# Patient Record
Sex: Female | Born: 1950 | Race: White | Hispanic: No | Smoking: Never smoker
Health system: Southern US, Community
[De-identification: ages and names within clinical notes are randomized; demographics above are authoritative.]

## PROBLEM LIST (undated history)

## (undated) DIAGNOSIS — B019 Varicella without complication: Secondary | ICD-10-CM

## (undated) DIAGNOSIS — C801 Malignant (primary) neoplasm, unspecified: Secondary | ICD-10-CM

## (undated) DIAGNOSIS — I10 Essential (primary) hypertension: Secondary | ICD-10-CM

## (undated) DIAGNOSIS — K900 Celiac disease: Secondary | ICD-10-CM

## (undated) HISTORY — PX: MOHS SURGERY: SHX181

## (undated) HISTORY — DX: Malignant (primary) neoplasm, unspecified: C80.1

## (undated) HISTORY — PX: APPENDECTOMY: SHX54

## (undated) HISTORY — DX: Varicella without complication: B01.9

## (undated) HISTORY — DX: Essential (primary) hypertension: I10

---

## 2009-06-07 HISTORY — PX: OTHER SURGICAL HISTORY: SHX169

## 2013-02-02 DIAGNOSIS — L568 Other specified acute skin changes due to ultraviolet radiation: Secondary | ICD-10-CM | POA: Insufficient documentation

## 2014-05-09 LAB — HM COLONOSCOPY

## 2014-08-22 DIAGNOSIS — D485 Neoplasm of uncertain behavior of skin: Secondary | ICD-10-CM | POA: Insufficient documentation

## 2015-06-11 DIAGNOSIS — C439 Malignant melanoma of skin, unspecified: Secondary | ICD-10-CM | POA: Diagnosis not present

## 2015-06-11 DIAGNOSIS — I10 Essential (primary) hypertension: Secondary | ICD-10-CM | POA: Diagnosis not present

## 2015-06-23 DIAGNOSIS — Z23 Encounter for immunization: Secondary | ICD-10-CM | POA: Diagnosis not present

## 2015-07-08 DIAGNOSIS — I10 Essential (primary) hypertension: Secondary | ICD-10-CM | POA: Diagnosis not present

## 2015-07-08 DIAGNOSIS — J8489 Other specified interstitial pulmonary diseases: Secondary | ICD-10-CM | POA: Diagnosis not present

## 2015-07-10 DIAGNOSIS — D485 Neoplasm of uncertain behavior of skin: Secondary | ICD-10-CM | POA: Diagnosis not present

## 2015-07-10 DIAGNOSIS — D225 Melanocytic nevi of trunk: Secondary | ICD-10-CM | POA: Diagnosis not present

## 2015-07-15 DIAGNOSIS — J189 Pneumonia, unspecified organism: Secondary | ICD-10-CM | POA: Diagnosis not present

## 2015-07-15 DIAGNOSIS — Z7189 Other specified counseling: Secondary | ICD-10-CM | POA: Diagnosis not present

## 2015-09-05 DIAGNOSIS — L259 Unspecified contact dermatitis, unspecified cause: Secondary | ICD-10-CM | POA: Diagnosis not present

## 2015-10-07 DIAGNOSIS — D485 Neoplasm of uncertain behavior of skin: Secondary | ICD-10-CM | POA: Diagnosis not present

## 2015-10-14 DIAGNOSIS — H5203 Hypermetropia, bilateral: Secondary | ICD-10-CM | POA: Diagnosis not present

## 2015-10-14 DIAGNOSIS — Z961 Presence of intraocular lens: Secondary | ICD-10-CM | POA: Diagnosis not present

## 2015-10-14 DIAGNOSIS — H43813 Vitreous degeneration, bilateral: Secondary | ICD-10-CM | POA: Diagnosis not present

## 2015-10-20 DIAGNOSIS — M94262 Chondromalacia, left knee: Secondary | ICD-10-CM | POA: Diagnosis not present

## 2015-10-20 DIAGNOSIS — Q682 Congenital deformity of knee: Secondary | ICD-10-CM | POA: Diagnosis not present

## 2015-10-23 DIAGNOSIS — L819 Disorder of pigmentation, unspecified: Secondary | ICD-10-CM | POA: Diagnosis not present

## 2015-10-31 DIAGNOSIS — Z1231 Encounter for screening mammogram for malignant neoplasm of breast: Secondary | ICD-10-CM | POA: Diagnosis not present

## 2015-10-31 DIAGNOSIS — R928 Other abnormal and inconclusive findings on diagnostic imaging of breast: Secondary | ICD-10-CM | POA: Diagnosis not present

## 2015-11-04 DIAGNOSIS — R928 Other abnormal and inconclusive findings on diagnostic imaging of breast: Secondary | ICD-10-CM | POA: Diagnosis not present

## 2015-11-04 DIAGNOSIS — Z1231 Encounter for screening mammogram for malignant neoplasm of breast: Secondary | ICD-10-CM | POA: Diagnosis not present

## 2015-11-04 DIAGNOSIS — Q682 Congenital deformity of knee: Secondary | ICD-10-CM | POA: Diagnosis not present

## 2015-11-04 DIAGNOSIS — M94262 Chondromalacia, left knee: Secondary | ICD-10-CM | POA: Diagnosis not present

## 2015-11-05 DIAGNOSIS — M94262 Chondromalacia, left knee: Secondary | ICD-10-CM | POA: Diagnosis not present

## 2015-11-07 DIAGNOSIS — M94262 Chondromalacia, left knee: Secondary | ICD-10-CM | POA: Diagnosis not present

## 2015-11-11 DIAGNOSIS — Q682 Congenital deformity of knee: Secondary | ICD-10-CM | POA: Diagnosis not present

## 2015-11-11 DIAGNOSIS — M94262 Chondromalacia, left knee: Secondary | ICD-10-CM | POA: Diagnosis not present

## 2015-11-13 DIAGNOSIS — M94262 Chondromalacia, left knee: Secondary | ICD-10-CM | POA: Diagnosis not present

## 2015-12-25 DIAGNOSIS — M810 Age-related osteoporosis without current pathological fracture: Secondary | ICD-10-CM | POA: Diagnosis not present

## 2015-12-31 DIAGNOSIS — Z124 Encounter for screening for malignant neoplasm of cervix: Secondary | ICD-10-CM | POA: Diagnosis not present

## 2016-01-08 DIAGNOSIS — L818 Other specified disorders of pigmentation: Secondary | ICD-10-CM | POA: Diagnosis not present

## 2016-01-22 DIAGNOSIS — H6123 Impacted cerumen, bilateral: Secondary | ICD-10-CM | POA: Diagnosis not present

## 2016-01-22 DIAGNOSIS — H900 Conductive hearing loss, bilateral: Secondary | ICD-10-CM | POA: Diagnosis not present

## 2016-02-20 DIAGNOSIS — S46011A Strain of muscle(s) and tendon(s) of the rotator cuff of right shoulder, initial encounter: Secondary | ICD-10-CM | POA: Diagnosis not present

## 2016-02-22 DIAGNOSIS — B029 Zoster without complications: Secondary | ICD-10-CM | POA: Diagnosis not present

## 2016-03-01 DIAGNOSIS — M792 Neuralgia and neuritis, unspecified: Secondary | ICD-10-CM | POA: Diagnosis not present

## 2016-03-09 DIAGNOSIS — K900 Celiac disease: Secondary | ICD-10-CM | POA: Diagnosis not present

## 2016-03-09 DIAGNOSIS — B028 Zoster with other complications: Secondary | ICD-10-CM | POA: Diagnosis not present

## 2016-03-09 DIAGNOSIS — M810 Age-related osteoporosis without current pathological fracture: Secondary | ICD-10-CM | POA: Diagnosis not present

## 2016-03-09 DIAGNOSIS — I10 Essential (primary) hypertension: Secondary | ICD-10-CM | POA: Diagnosis not present

## 2016-03-09 LAB — HM MAMMOGRAPHY: HM MAMMO: NORMAL (ref 0–4)

## 2016-04-19 DIAGNOSIS — Z23 Encounter for immunization: Secondary | ICD-10-CM | POA: Diagnosis not present

## 2016-04-22 DIAGNOSIS — D485 Neoplasm of uncertain behavior of skin: Secondary | ICD-10-CM | POA: Diagnosis not present

## 2016-04-22 DIAGNOSIS — B029 Zoster without complications: Secondary | ICD-10-CM | POA: Insufficient documentation

## 2016-05-17 DIAGNOSIS — D485 Neoplasm of uncertain behavior of skin: Secondary | ICD-10-CM | POA: Diagnosis not present

## 2016-05-17 DIAGNOSIS — L905 Scar conditions and fibrosis of skin: Secondary | ICD-10-CM | POA: Diagnosis not present

## 2016-05-29 DIAGNOSIS — Z23 Encounter for immunization: Secondary | ICD-10-CM | POA: Diagnosis not present

## 2016-06-22 DIAGNOSIS — M810 Age-related osteoporosis without current pathological fracture: Secondary | ICD-10-CM | POA: Diagnosis not present

## 2016-06-22 DIAGNOSIS — Z0000 Encounter for general adult medical examination without abnormal findings: Secondary | ICD-10-CM | POA: Diagnosis not present

## 2016-06-22 DIAGNOSIS — I10 Essential (primary) hypertension: Secondary | ICD-10-CM | POA: Diagnosis not present

## 2016-07-15 DIAGNOSIS — L818 Other specified disorders of pigmentation: Secondary | ICD-10-CM | POA: Diagnosis not present

## 2016-07-15 DIAGNOSIS — Z85820 Personal history of malignant melanoma of skin: Secondary | ICD-10-CM | POA: Diagnosis not present

## 2016-07-15 DIAGNOSIS — L570 Actinic keratosis: Secondary | ICD-10-CM | POA: Diagnosis not present

## 2016-09-12 DIAGNOSIS — R05 Cough: Secondary | ICD-10-CM | POA: Diagnosis not present

## 2016-09-12 DIAGNOSIS — T7840XA Allergy, unspecified, initial encounter: Secondary | ICD-10-CM | POA: Diagnosis not present

## 2016-09-13 DIAGNOSIS — H6983 Other specified disorders of Eustachian tube, bilateral: Secondary | ICD-10-CM | POA: Diagnosis not present

## 2016-09-13 DIAGNOSIS — H6123 Impacted cerumen, bilateral: Secondary | ICD-10-CM | POA: Diagnosis not present

## 2016-09-13 DIAGNOSIS — H900 Conductive hearing loss, bilateral: Secondary | ICD-10-CM | POA: Diagnosis not present

## 2016-09-28 DIAGNOSIS — G4700 Insomnia, unspecified: Secondary | ICD-10-CM | POA: Diagnosis not present

## 2017-01-12 ENCOUNTER — Encounter: Payer: Self-pay | Admitting: Family

## 2017-01-12 ENCOUNTER — Ambulatory Visit (INDEPENDENT_AMBULATORY_CARE_PROVIDER_SITE_OTHER): Payer: Medicare Other | Admitting: Family

## 2017-01-12 VITALS — BP 108/70 | HR 76 | Temp 98.2°F | Wt 133.6 lb

## 2017-01-12 DIAGNOSIS — M810 Age-related osteoporosis without current pathological fracture: Secondary | ICD-10-CM | POA: Diagnosis not present

## 2017-01-12 DIAGNOSIS — Z85820 Personal history of malignant melanoma of skin: Secondary | ICD-10-CM

## 2017-01-12 DIAGNOSIS — K900 Celiac disease: Secondary | ICD-10-CM | POA: Diagnosis not present

## 2017-01-12 DIAGNOSIS — Z1231 Encounter for screening mammogram for malignant neoplasm of breast: Secondary | ICD-10-CM

## 2017-01-12 DIAGNOSIS — K219 Gastro-esophageal reflux disease without esophagitis: Secondary | ICD-10-CM | POA: Diagnosis not present

## 2017-01-12 DIAGNOSIS — Z1211 Encounter for screening for malignant neoplasm of colon: Secondary | ICD-10-CM | POA: Insufficient documentation

## 2017-01-12 DIAGNOSIS — I10 Essential (primary) hypertension: Secondary | ICD-10-CM | POA: Diagnosis not present

## 2017-01-12 DIAGNOSIS — Z1239 Encounter for other screening for malignant neoplasm of breast: Secondary | ICD-10-CM

## 2017-01-12 MED ORDER — AMLODIPINE BESYLATE 2.5 MG PO TABS: 2.5000 mg | ORAL_TABLET | Freq: Every day | ORAL | 2 refills | Status: DC

## 2017-01-12 MED ORDER — RANITIDINE HCL 150 MG PO TABS: 150.0000 mg | ORAL_TABLET | Freq: Two times a day (BID) | ORAL | 1 refills | Status: DC

## 2017-01-12 NOTE — Patient Instructions (Addendum)
Bring immunization records- including pneumonia, shingles, tetanus   Long term use beyond 3 months of proton pump inhibitors , also called PPI's, is associated with malabsorption of vitamins, chronic kidney disease, fracture risk, and diarrheal illnesses. PPI's include Nexium, Prilosec, Protonix, Dexilant, and Prevacid.   I generally recommend trying to control acid reflux with lifestyle modifications including avoiding trigger foods, not eating 2 hours prior to bedtime. You may use histamine 2 blockers daily to twice daily ( this is Zantac, Pepcid) and then when symptoms flare, start back on PPI for short course.   Try Zantac 150mg  twice per day.   Your dexa scan shows osteoporosis. Your fracture risk for the next 10 years is 3% for your hip and 13% for major osteoporotic fracture.   We need to follow this and monitor again in 2 more years to see if improved or worsened.  For post menopausal women, guidelines recommend a diet with 1200 mg of Calcium per day. If you are eating calcium rich foods, you do not need a calcium supplement. The body better absorbs the calcium that you eat over supplementation. If you do supplement, I recommend not supplementing the full 1200 mg/ day as this can lead to increased risk of cardiovascular disease. I recommend Calcium Citrate over the counter, and you may take a total of 600 to 800 mg per day in divided doses with meals for best absorption.   For bone health, you need adequate vitamin D, and I recommend you supplement as it is harder to do so with diet alone. I recommend cholecalciferol 800 units daily.  Also, please ensure you are following a diet high in calcium -- research shows better outcomes with dietary sources including kale, yogurt, broccolii, cheese, okra, almonds- to name a few.     Also remember that exercise is a great medicine for maintain and preserve bone health. Advise moderate exercise for 30 minutes , 3 times per week.

## 2017-01-12 NOTE — Assessment & Plan Note (Signed)
Controlled. Will follow 

## 2017-01-12 NOTE — Assessment & Plan Note (Signed)
Ordered. Advised 3d. Patient understands to schedule.

## 2017-01-12 NOTE — Progress Notes (Signed)
Subjective:    Patient ID: Susan Stanley, female    DOB: 07-17-1950, 66 y.o.   MRN: [id number]  CC: [name] is a 66 y.o. female who presents today to establish care.    HPI: HTN- compliant with medication. Denies exertional chest pain or pressure, numbness or tingling radiating to left arm or jaw, palpitations, dizziness, frequent headaches, changes in vision, or shortness of breath.   Celiac- controlled by diet.   GERD- on omeprazole QOD with control.      Osteoporosis- taking calcium supplement, 600mg . last DEXA 2017 showed spine t score -2.6, osteoporotic.   Tried fosamax years ago and stopped due to concern for side effects ; notes also about to have dental implant.   Colo- polyps removed 2015, neg high grade dysplasia EGD- non erosive gastritis 2015, neg h pylori, giardia.  HISTORY:  Past Medical History:  Diagnosis Date  . Cancer ([place])    atypical melanoma  . Chicken pox   . Hypertension    Past Surgical History:  Procedure Laterality Date  . APPENDECTOMY     Family History  Problem Relation Age of Onset  . Alcohol abuse Mother   . Cancer Mother        lung  . Alcohol abuse Father   . Cancer Father        lung  . Hypertension Father   . Alcohol abuse Sister   . Arthritis Paternal Grandmother   . Cancer Paternal Grandfather        lung    Allergies: Patient has no allergy information on record. No current outpatient prescriptions on file prior to visit.   No current facility-administered medications on file prior to visit.     Social History  Substance Use Topics  . Smoking status: Never Smoker  . Smokeless tobacco: Never Used  . Alcohol use No    Review of Systems  Constitutional: Negative for chills and fever.  Eyes: Negative for visual disturbance.  Respiratory: Negative for cough.   Cardiovascular: Negative for chest pain and palpitations.  Gastrointestinal: Negative for nausea and vomiting.  Neurological: Negative for headaches.        Objective:    BP 108/70   Pulse 76   Temp 98.2 F (36.8 C) (Oral)   Wt 133 lb 9.6 oz (60.6 kg)   SpO2 98%  BP Readings from Last 3 Encounters:  01/12/17 108/70   Wt Readings from Last 3 Encounters:  01/12/17 133 lb 9.6 oz (60.6 kg)    Physical Exam  Constitutional: She appears well-developed and well-nourished.  Eyes: Conjunctivae are normal.  Cardiovascular: Normal rate, regular rhythm, normal heart sounds and normal pulses.   Pulmonary/Chest: Effort normal and breath sounds normal. She has no wheezes. She has no rhonchi. She has no rales.  Neurological: She is alert.  Skin: Skin is warm and dry.  Psychiatric: She has a normal mood and affect. Her speech is normal and behavior is normal. Thought content normal.  Vitals reviewed.      Assessment & Plan:   Problem List Items Addressed This Visit      Cardiovascular and Mediastinum   HTN (hypertension) - Primary    At goal. Will continue current regimen      Relevant Medications   amLODipine (NORVASC) 2.5 MG tablet     Digestive   Celiac disease    Controlled. Will follow      GERD (gastroesophageal reflux disease)   Relevant Medications   ranitidine (ZANTAC) 150 MG  tablet     Musculoskeletal and Integument   Osteoporosis    Reviewed last DEXA with patient and calculated FRAX score.-Major risk 13% and hip is 3%.patient declined any medication at this time and prefers lifestyle modification. Discussed appropriate vitamin D and calcium supplementation.  Discussed repeating DEXA 2019 to see if any change.         Other   History of melanoma    Follows with derm.      Screening for breast cancer    Ordered. Advised 3d. Patient understands to schedule.      Relevant Orders   MM SCREENING BREAST TOMO BILATERAL       I have changed Ms. Games's amLODipine. I am also having her start on ranitidine.   Meds ordered this encounter  Medications  . DISCONTD: amLODipine (NORVASC) 2.5 MG tablet    Sig: Take  2.5 mg by mouth daily.  . ranitidine (ZANTAC) 150 MG tablet    Sig: Take 1 tablet (150 mg total) by mouth 2 (two) times daily.    Dispense:  60 tablet    Refill:  1    Order Specific Question:   Supervising Provider    Answer:   Deborra Medina L [[id number]]  . amLODipine (NORVASC) 2.5 MG tablet    Sig: Take 1 tablet (2.5 mg total) by mouth daily.    Dispense:  90 tablet    Refill:  2    Order Specific Question:   Supervising Provider    Answer:   Crecencio Mc [[id number]]    Return precautions given.   Risks, benefits, and alternatives of the medications and treatment plan prescribed today were discussed, and patient expressed understanding.   Education regarding symptom management and diagnosis given to patient on AVS.  Continue to follow with Burnard Hawthorne, FNP for routine health maintenance.   Esteen Boling and I agreed with plan.   [name], FNP

## 2017-01-12 NOTE — Assessment & Plan Note (Signed)
Reviewed last DEXA with patient and calculated FRAX score.-Major risk 13% and hip is 3%.patient declined any medication at this time and prefers lifestyle modification. Discussed appropriate vitamin D and calcium supplementation.  Discussed repeating DEXA 2019 to see if any change.

## 2017-01-12 NOTE — Assessment & Plan Note (Signed)
Follows with derm

## 2017-01-12 NOTE — Progress Notes (Signed)
Pre visit review using our clinic review tool, if applicable. No additional management support is needed unless otherwise documented below in the visit note. 

## 2017-01-12 NOTE — Assessment & Plan Note (Signed)
At goal. Will continue current regimen.  

## 2017-01-20 DIAGNOSIS — L570 Actinic keratosis: Secondary | ICD-10-CM | POA: Diagnosis not present

## 2017-01-20 DIAGNOSIS — L578 Other skin changes due to chronic exposure to nonionizing radiation: Secondary | ICD-10-CM | POA: Diagnosis not present

## 2017-01-20 DIAGNOSIS — Z1283 Encounter for screening for malignant neoplasm of skin: Secondary | ICD-10-CM | POA: Diagnosis not present

## 2017-01-20 DIAGNOSIS — D1801 Hemangioma of skin and subcutaneous tissue: Secondary | ICD-10-CM | POA: Diagnosis not present

## 2017-01-20 DIAGNOSIS — D2270 Melanocytic nevi of unspecified lower limb, including hip: Secondary | ICD-10-CM | POA: Diagnosis not present

## 2017-01-20 DIAGNOSIS — L821 Other seborrheic keratosis: Secondary | ICD-10-CM | POA: Diagnosis not present

## 2017-01-20 DIAGNOSIS — L814 Other melanin hyperpigmentation: Secondary | ICD-10-CM | POA: Diagnosis not present

## 2017-01-20 DIAGNOSIS — D225 Melanocytic nevi of trunk: Secondary | ICD-10-CM | POA: Diagnosis not present

## 2017-01-20 DIAGNOSIS — D2260 Melanocytic nevi of unspecified upper limb, including shoulder: Secondary | ICD-10-CM | POA: Diagnosis not present

## 2017-02-08 ENCOUNTER — Encounter: Payer: Self-pay | Admitting: *Deleted

## 2017-02-22 ENCOUNTER — Ambulatory Visit
Admission: RE | Admit: 2017-02-22 | Discharge: 2017-02-22 | Disposition: A | Discharge status: Home / Self Care | Payer: Medicare Other | Source: Ambulatory Visit | Attending: Family | Admitting: Family

## 2017-02-22 DIAGNOSIS — L820 Inflamed seborrheic keratosis: Secondary | ICD-10-CM | POA: Diagnosis not present

## 2017-02-22 DIAGNOSIS — Z1239 Encounter for other screening for malignant neoplasm of breast: Secondary | ICD-10-CM

## 2017-02-22 DIAGNOSIS — R208 Other disturbances of skin sensation: Secondary | ICD-10-CM | POA: Diagnosis not present

## 2017-02-22 DIAGNOSIS — Z1231 Encounter for screening mammogram for malignant neoplasm of breast: Secondary | ICD-10-CM | POA: Insufficient documentation

## 2017-02-22 DIAGNOSIS — Z85828 Personal history of other malignant neoplasm of skin: Secondary | ICD-10-CM | POA: Diagnosis not present

## 2017-02-23 DIAGNOSIS — H43813 Vitreous degeneration, bilateral: Secondary | ICD-10-CM | POA: Diagnosis not present

## 2017-04-27 DIAGNOSIS — L570 Actinic keratosis: Secondary | ICD-10-CM | POA: Diagnosis not present

## 2017-04-27 DIAGNOSIS — Z85820 Personal history of malignant melanoma of skin: Secondary | ICD-10-CM | POA: Diagnosis not present

## 2017-04-27 DIAGNOSIS — D2271 Melanocytic nevi of right lower limb, including hip: Secondary | ICD-10-CM | POA: Diagnosis not present

## 2017-04-27 DIAGNOSIS — X32XXXA Exposure to sunlight, initial encounter: Secondary | ICD-10-CM | POA: Diagnosis not present

## 2017-04-27 DIAGNOSIS — L565 Disseminated superficial actinic porokeratosis (DSAP): Secondary | ICD-10-CM | POA: Diagnosis not present

## 2017-04-27 DIAGNOSIS — D225 Melanocytic nevi of trunk: Secondary | ICD-10-CM | POA: Diagnosis not present

## 2017-07-29 ENCOUNTER — Ambulatory Visit: Payer: Medicare Other

## 2017-08-15 ENCOUNTER — Ambulatory Visit (INDEPENDENT_AMBULATORY_CARE_PROVIDER_SITE_OTHER): Payer: Medicare Other

## 2017-08-15 ENCOUNTER — Other Ambulatory Visit: Payer: Self-pay

## 2017-08-15 VITALS — BP 110/70 | HR 69 | Temp 98.4°F | Resp 14 | Ht 66.0 in | Wt 135.1 lb

## 2017-08-15 DIAGNOSIS — Z0000 Encounter for general adult medical examination without abnormal findings: Secondary | ICD-10-CM | POA: Diagnosis not present

## 2017-08-15 DIAGNOSIS — I10 Essential (primary) hypertension: Secondary | ICD-10-CM

## 2017-08-15 MED ORDER — AMLODIPINE BESYLATE 2.5 MG PO TABS: 2.5000 mg | ORAL_TABLET | Freq: Every day | ORAL | 2 refills | Status: DC

## 2017-08-15 MED ORDER — OMEPRAZOLE 20 MG PO CPDR: 20.0000 mg | DELAYED_RELEASE_CAPSULE | Freq: Every day | ORAL | 2 refills | Status: DC

## 2017-08-15 NOTE — Progress Notes (Addendum)
Subjective:   Susan Stanley is a 67 y.o. female who presents for an Initial Medicare Annual Wellness Visit.  Review of Systems    No ROS.  Medicare Wellness Visit. Additional risk factors are reflected in the social history.  Cardiac Risk Factors include: advanced age (>82men, >25 women);hypertension     Objective:    Today's Vitals   08/15/17 0823  BP: 110/70  Pulse: 69  Resp: 14  Temp: 98.4 F (36.9 C)  TempSrc: Oral  SpO2: 97%  Weight: 135 lb 1.9 oz (61.3 kg)  Height: 5\' 6"  (1.676 m)   Body mass index is 21.81 kg/m.  Advanced Directives 08/15/2017  Does Patient Have a Medical Advance Directive? No  Would patient like information on creating a medical advance directive? No - Patient declined    Current Medications (verified) Outpatient Encounter Medications as of 08/15/2017  Medication Sig  . omeprazole (PRILOSEC) 20 MG capsule Take 20 mg by mouth daily.  Marland Kitchen amLODipine (NORVASC) 2.5 MG tablet Take 1 tablet (2.5 mg total) by mouth daily.  . [DISCONTINUED] ranitidine (ZANTAC) 150 MG tablet Take 1 tablet (150 mg total) by mouth 2 (two) times daily.   No facility-administered encounter medications on file as of 08/15/2017.     Allergies (verified) Patient has no allergy information on record.   History: Past Medical History:  Diagnosis Date  . Cancer ([place])    atypical melanoma  . Chicken pox   . Hypertension    Past Surgical History:  Procedure Laterality Date  . APPENDECTOMY     Family History  Problem Relation Age of Onset  . Alcohol abuse Mother   . COPD Mother   . Alcohol abuse Father   . Cancer Father        lung  . Hypertension Father   . Alcohol abuse Sister   . Arthritis Paternal Grandmother   . Cancer Paternal Grandfather        lung   Social History   Socioeconomic History  . Marital status: Divorced    Spouse name: None  . Number of children: None  . Years of education: None  . Highest education level: None  Social Needs  . Financial  resource strain: None  . Food insecurity - worry: Never true  . Food insecurity - inability: Never true  . Transportation needs - medical: No  . Transportation needs - non-medical: No  Occupational History  . None  Tobacco Use  . Smoking status: Never Smoker  . Smokeless tobacco: Never Used  Substance and Sexual Activity  . Alcohol use: No  . Drug use: No  . Sexual activity: No  Other Topics Concern  . None  Social History Narrative   Daughter is Environmental manager      From [place]             Tobacco Counseling Counseling given: Not Answered   Clinical Intake:  Pre-visit preparation completed: Yes  Pain : No/denies pain     Nutritional Status: BMI of 19-24  Normal Diabetes: No  How often do you need to have someone help you when you read instructions, pamphlets, or other written materials from your doctor or pharmacy?: 1 - Never  Interpreter Needed?: No      Activities of Daily Living In your present state of health, do you have any difficulty performing the following activities: 08/15/2017  Hearing? N  Vision? N  Difficulty concentrating or making decisions? N  Walking or climbing stairs? N  Dressing  or bathing? N  Doing errands, shopping? N  Preparing Food and eating ? N  Using the Toilet? N  In the past six months, have you accidently leaked urine? N  Do you have problems with loss of bowel control? N  Managing your Medications? N  Managing your Finances? N  Housekeeping or managing your Housekeeping? N  Some recent data might be hidden     Immunizations and Health Maintenance Immunization History  Administered Date(s) Administered  . Pneumococcal Conjugate-13 05/07/2016  . Pneumococcal Polysaccharide-23 05/17/2017  . Tdap 03/08/2016  . Zoster Recombinat (Shingrix) 11/05/2016, 06/21/2017   Health Maintenance Due  Topic Date Due  . Hepatitis C Screening  Jul 01, 1950  . PNA vac Low Risk Adult (1 of 2 - PCV13) 06/25/2013    Patient Care  Team: Burnard Hawthorne, FNP as PCP - General (Family Medicine)  Indicate any recent Medical Services you may have received from other than [facility] providers in the past year (date may be approximate).     Assessment:   This is a routine wellness examination for Susan Stanley.  The goal of the wellness visit is to assist the patient how to close the gaps in care and create a preventative care plan for the patient.   The roster of all physicians providing medical care to patient is listed in the Snapshot section of the chart.  Osteoporosis reviewed.    Safety issues reviewed; Lives alone.  Smoke and carbon monoxide detectors in the home. No firearms or firearms locked in a safe within the home. Wears seatbelts when driving or riding with others. No violence in the home.  They do not have excessive sun exposure.  Discussed the need for sun protection: hats, long sleeves and the use of sunscreen if there is significant sun exposure.  Patient is alert, normal appearance, oriented to person/place/and time. Correctly identified the president of the [place] and recalls of 2/3 words. Performs simple calculations and can read correct time from watch face. Displays appropriate judgement.  No new identified risk were noted.  No failures at ADL's or IADL's.    BMI- discussed the importance of a healthy diet, water intake and the benefits of aerobic exercise. Educational material provided.   24 hour diet recall: Celiac disease; wheat free diet  Daily caffeine intake: 2 cups    Dental- every 12 months  Eye- Visual acuity not assessed per patient preference since they have regular follow up with the ophthalmologist.    Sleep patterns- Sleeps 7-8 hours at night.  Wakes feeling rested.  Hepatitis C deferred per patient preference.    Influenza vaccine declined.    Patient Concerns: She has stopped Zantac 150mg  BID and would like to add Omeprazole 20mg  daily.  Pended for PCP signature.  Refill for  amlodipine sent to [facility].  Follow up with PCP scheduled.    Hearing/Vision screen Hearing Screening Comments: Patient is able to hear conversational tones without difficulty.  No issues reported.   Vision Screening Comments: Followed [facility] Annual visits [facility] Cataract extraction, bilateral Visual acuity not assessed per patient preference since they have regular follow up with the ophthalmologist  Dietary issues and exercise activities discussed: Current Exercise Habits: Home exercise routine, Type of exercise: walking, Time (Minutes): 20, Frequency (Times/Week): 4, Weekly Exercise (Minutes/Week): 80, Intensity: Mild  Goals    . Increase physical activity     [employer] structured classes      Depression Screen PHQ 2/9 Scores 08/15/2017  PHQ - 2 Score 0    Fall Risk Fall Risk  08/15/2017  Falls in the past year? No    Cognitive Function: MMSE - Mini Mental State Exam 08/15/2017  Orientation to time 5  Orientation to Place 5  Registration 3  Attention/ Calculation 5  Recall 3  Language- name 2 objects 2  Language- repeat 1  Language- follow 3 step command 3  Language- read & follow direction 1  Write a sentence 1  Copy design 1  Total score 30        Screening Tests Health Maintenance  Topic Date Due  . Hepatitis C Screening  11-Jan-1951  . PNA vac Low Risk Adult (1 of 2 - PCV13) 06/25/2013  . INFLUENZA VACCINE  04/17/2018 (Originally 01/05/2017)  . MAMMOGRAM  02/23/2019  . COLONOSCOPY  05/10/2019  . TETANUS/TDAP  06/08/2023  . DEXA SCAN  Completed      Plan:    End of life planning; Advanced aging; Advanced directives discussed.  No HCPOA/Living Will.  Additional information declined at this time.  I have personally reviewed and noted the following in the patient's chart:   . Medical and social history . Use of alcohol, tobacco or illicit drugs  . Current medications and supplements . Functional ability and  status . Nutritional status . Physical activity . Advanced directives . List of other physicians . Hospitalizations, surgeries, and ER visits in previous 12 months . Vitals . Screenings to include cognitive, depression, and falls . Referrals and appointments  In addition, I have reviewed and discussed with patient certain preventive protocols, quality metrics, and best practice recommendations. A written personalized care plan for preventive services as well as general preventive health recommendations were provided to patient.     Varney Biles, LPN   9/60/4540   Agree with plan. [name], NP Of note, patient and I have discussed long term use of PPIs

## 2017-08-15 NOTE — Patient Instructions (Addendum)
  Ms. Kiene , Thank you for taking time to come for your Medicare Wellness Visit. I appreciate your ongoing commitment to your health goals. Please review the following plan we discussed and let me know if I can assist you in the future.   Have a great day!  These are the goals we discussed: Goals    . Increase physical activity     [employer] structured classes       This is a list of the screening recommended for you and due dates:  Health Maintenance  Topic Date Due  .  Hepatitis C: One time screening is recommended by Center for Disease Control  (CDC) for  adults born from [birth year] through 1967.   1951/03/23  . Pneumonia vaccines (1 of 2 - PCV13) 06/25/2013  . Flu Shot  04/17/2018*  . Mammogram  02/23/2019  . Colon Cancer Screening  05/10/2019  . Tetanus Vaccine  06/08/2023  . DEXA scan (bone density measurement)  Completed  *Topic was postponed. The date shown is not the original due date.

## 2017-09-02 ENCOUNTER — Ambulatory Visit: Payer: Medicare Other | Admitting: Family

## 2017-09-14 ENCOUNTER — Ambulatory Visit: Payer: Medicare Other | Admitting: Family

## 2017-10-12 ENCOUNTER — Encounter: Payer: Self-pay | Admitting: Family

## 2017-10-12 ENCOUNTER — Ambulatory Visit (INDEPENDENT_AMBULATORY_CARE_PROVIDER_SITE_OTHER): Payer: Medicare Other | Admitting: Family

## 2017-10-12 VITALS — BP 124/74 | HR 71 | Temp 98.8°F | Resp 16 | Wt 139.0 lb

## 2017-10-12 DIAGNOSIS — K219 Gastro-esophageal reflux disease without esophagitis: Secondary | ICD-10-CM | POA: Diagnosis not present

## 2017-10-12 DIAGNOSIS — Z136 Encounter for screening for cardiovascular disorders: Secondary | ICD-10-CM

## 2017-10-12 DIAGNOSIS — E559 Vitamin D deficiency, unspecified: Secondary | ICD-10-CM | POA: Diagnosis not present

## 2017-10-12 DIAGNOSIS — Z13220 Encounter for screening for lipoid disorders: Secondary | ICD-10-CM

## 2017-10-12 DIAGNOSIS — H6123 Impacted cerumen, bilateral: Secondary | ICD-10-CM | POA: Diagnosis not present

## 2017-10-12 DIAGNOSIS — I10 Essential (primary) hypertension: Secondary | ICD-10-CM | POA: Diagnosis not present

## 2017-10-12 DIAGNOSIS — Z1159 Encounter for screening for other viral diseases: Secondary | ICD-10-CM | POA: Diagnosis not present

## 2017-10-12 LAB — COMPREHENSIVE METABOLIC PANEL
ALK PHOS: 149 U/L — AB (ref 39–117)
ALT: 11 U/L (ref 0–35)
AST: 14 U/L (ref 0–37)
Albumin: 4.3 g/dL (ref 3.5–5.2)
BUN: 15 mg/dL (ref 6–23)
CALCIUM: 9.4 mg/dL (ref 8.4–10.5)
CO2: 29 meq/L (ref 19–32)
CREATININE: 0.59 mg/dL (ref 0.40–1.20)
Chloride: 101 mEq/L (ref 96–112)
GFR: 107.32 mL/min (ref >60.00)
GLUCOSE: 98 mg/dL (ref 70–99)
Potassium: 4.2 mEq/L (ref 3.5–5.1)
Sodium: 137 mEq/L (ref 135–145)
Total Bilirubin: 0.3 mg/dL (ref 0.2–1.2)
Total Protein: 7.6 g/dL (ref 6.0–8.3)

## 2017-10-12 LAB — LIPID PANEL
CHOL/HDL RATIO: 4
Cholesterol: 212 mg/dL — ABNORMAL HIGH (ref 0–200)
HDL: 54.4 mg/dL (ref >39.00)
LDL Cholesterol: 141 mg/dL — ABNORMAL HIGH (ref 0–99)
NONHDL: 157.62
Triglycerides: 83 mg/dL (ref 0.0–149.0)
VLDL: 16.6 mg/dL (ref 0.0–40.0)

## 2017-10-12 LAB — VITAMIN D 25 HYDROXY (VIT D DEFICIENCY, FRACTURES): VITD: 20.53 ng/mL — AB (ref 30.00–100.00)

## 2017-10-12 NOTE — Progress Notes (Addendum)
Subjective:    Patient ID: Niala Prue, female    DOB: 07-08-1950, 67 y.o.   MRN: [id number]  CC: Yoshie Rubi is a 67 y.o. female who presents today for follow up.   HPI: Overall feels well today.  HTN-compliant with medication.  No chest pain, shortness of breath.  GERD-had to go back to using Prilosec daily.  No hoarseness, cough, trouble swallowing.  Plans for EGD with colonoscopy next due.   Does state that her ears may need 'cleaning out' and she is an 'ear wax maker.'  feels like hearing is affected. No ear discharge, ear pain.     HISTORY:  Past Medical History:  Diagnosis Date  . Cancer ([place])    atypical melanoma  . Chicken pox   . Hypertension    Past Surgical History:  Procedure Laterality Date  . APPENDECTOMY     Family History  Problem Relation Age of Onset  . Alcohol abuse Mother   . COPD Mother   . Alcohol abuse Father   . Cancer Father        lung  . Hypertension Father   . Alcohol abuse Sister   . Arthritis Paternal Grandmother   . Cancer Paternal Grandfather        lung    Allergies: Patient has no allergy information on record. Current Outpatient Medications on File Prior to Visit  Medication Sig Dispense Refill  . amLODipine (NORVASC) 2.5 MG tablet Take 1 tablet (2.5 mg total) by mouth daily. 90 tablet 2  . omeprazole (PRILOSEC) 20 MG capsule Take 1 capsule (20 mg total) by mouth at bedtime. 90 capsule 2   No current facility-administered medications on file prior to visit.     Social History   Tobacco Use  . Smoking status: Never Smoker  . Smokeless tobacco: Never Used  Substance Use Topics  . Alcohol use: No  . Drug use: No    Review of Systems  Constitutional: Negative for chills and fever.  HENT: Positive for hearing loss. Negative for ear discharge, ear pain, facial swelling, trouble swallowing and voice change.   Respiratory: Negative for cough.   Cardiovascular: Negative for chest pain and palpitations.  Gastrointestinal:  Negative for nausea and vomiting.      Objective:    BP 124/74 (BP Location: Left Arm, Patient Position: Sitting, Cuff Size: Normal)   Pulse 71   Temp 98.8 F (37.1 C) (Oral)   Resp 16   Wt 139 lb (63 kg)   SpO2 98%   BMI 22.44 kg/m  BP Readings from Last 3 Encounters:  10/12/17 124/74  08/15/17 110/70  01/12/17 108/70   Wt Readings from Last 3 Encounters:  10/12/17 139 lb (63 kg)  08/15/17 135 lb 1.9 oz (61.3 kg)  01/12/17 133 lb 9.6 oz (60.6 kg)    Physical Exam  Constitutional: She appears well-developed and well-nourished.  HENT:  Bilateral cerumen impaction  Eyes: Conjunctivae are normal.  Cardiovascular: Normal rate, regular rhythm, normal heart sounds and normal pulses.  Pulmonary/Chest: Effort normal and breath sounds normal. She has no wheezes. She has no rhonchi. She has no rales.  Neurological: She is alert.  Skin: Skin is warm and dry.  Psychiatric: She has a normal mood and affect. Her speech is normal and behavior is normal. Thought content normal.  Vitals reviewed. Bilateral cerumen impactions, largely resolved with irrigation by CMA in both left and right ear.  After which, I also examined patient and the EAC's and TM's  improved. Still dried cerumen along edges. Advised debrox to loosen cerumen     Assessment & Plan:   Problem List Items Addressed This Visit      Cardiovascular and Mediastinum   HTN (hypertension) - Primary    Well-controlled.  Continue current regimen.      Relevant Orders   Comprehensive metabolic panel     Digestive   GERD (gastroesophageal reflux disease)    Discussion regaridng risk about long-term acid reflux and esophageal cancer.   Patient feels comfortable staying on PPI for now.  Failed Zantac.  Emphasized the importance of repeating her EGD in the near future;  also discussed red flags of esophageal cancer.  Will follow.        Nervous and Auditory   Bilateral impacted cerumen    resolved        Other    Vitamin D deficiency    History of. Pending labs      Relevant Orders   VITAMIN D 25 Hydroxy (Vit-D Deficiency, Fractures)    Other Visit Diagnoses    Encounter for lipid screening for cardiovascular disease       Relevant Orders   Lipid panel   Encounter for hepatitis C screening test for low risk patient       Relevant Orders   Hepatitis C antibody       I am having Charvi Vanessen maintain her amLODipine and omeprazole.   No orders of the defined types were placed in this encounter.   Return precautions given.   Risks, benefits, and alternatives of the medications and treatment plan prescribed today were discussed, and patient expressed understanding.   Education regarding symptom management and diagnosis given to patient on AVS.  Continue to follow with Burnard Hawthorne, FNP for routine health maintenance.   Eternity Vanderpol and I agreed with plan.   [name], FNP

## 2017-10-12 NOTE — Assessment & Plan Note (Signed)
Discussion regaridng risk about long-term acid reflux and esophageal cancer.   Patient feels comfortable staying on PPI for now.  Failed Zantac.  Emphasized the importance of repeating her EGD in the near future;  also discussed red flags of esophageal cancer.  Will follow.

## 2017-10-12 NOTE — Assessment & Plan Note (Signed)
Well-controlled.  Continue current regimen. 

## 2017-10-12 NOTE — Patient Instructions (Signed)
May try OTC Debrox  Pleasure seeing you!

## 2017-10-12 NOTE — Addendum Note (Signed)
Addended by: Johna Sheriff on: 10/12/2017 10:34 AM   Modules accepted: Orders

## 2017-10-12 NOTE — Progress Notes (Signed)
Bilateral ear lavage performed on right ear and left ear.  Successfully irrigated left ear and right ear irrigated and had a little remaining wax in ear.

## 2017-10-12 NOTE — Assessment & Plan Note (Signed)
resolved 

## 2017-10-12 NOTE — Assessment & Plan Note (Signed)
History of. Pending labs

## 2017-10-13 LAB — HEPATITIS C ANTIBODY
HEP C AB: NONREACTIVE
SIGNAL TO CUT-OFF: 0.02 (ref <1.00)

## 2017-10-16 ENCOUNTER — Other Ambulatory Visit: Payer: Self-pay | Admitting: Family

## 2017-10-16 DIAGNOSIS — R748 Abnormal levels of other serum enzymes: Secondary | ICD-10-CM

## 2017-10-18 ENCOUNTER — Telehealth: Payer: Self-pay

## 2017-10-18 ENCOUNTER — Encounter: Payer: Self-pay | Admitting: Family

## 2017-10-18 NOTE — Telephone Encounter (Signed)
Please advise 

## 2017-10-18 NOTE — Telephone Encounter (Signed)
Patient is calling to follow up on these test results being released in [place].

## 2017-10-18 NOTE — Telephone Encounter (Signed)
Patient is concerned due to ALT is elevated. Please advise.   She has made lab appointment to re-check in 1 month. Please advise.

## 2017-10-18 NOTE — Telephone Encounter (Signed)
Copied from [place] [phone]. Topic: Quick Communication - Lab Results >> Oct 17, 2017  4:02 PM Bare, Patriciaann Clan, CMA wrote: Called patient to inform them of  lab results. When patient returns call, triage nurse may} disclose results. >> Oct 18, 2017  9:35 AM Scherrie Gerlach wrote: Pt would like to view these results on mychart. Can you release them?

## 2017-10-19 ENCOUNTER — Other Ambulatory Visit: Payer: Self-pay | Admitting: Family

## 2017-10-19 DIAGNOSIS — M810 Age-related osteoporosis without current pathological fracture: Secondary | ICD-10-CM

## 2017-10-19 NOTE — Telephone Encounter (Signed)
Patient advised of below and verbalized understanding.  

## 2017-10-19 NOTE — Progress Notes (Signed)
close

## 2017-10-19 NOTE — Telephone Encounter (Signed)
Call pt May reassure her. It is mildly elevated and often times this is a transient nonspecific finding.  Most important to see if persists and then we can discuss further evaluation if persists  Please advise her not to worry at this time- lets await on repeat labs :)

## 2017-10-26 DIAGNOSIS — D224 Melanocytic nevi of scalp and neck: Secondary | ICD-10-CM | POA: Diagnosis not present

## 2017-10-26 DIAGNOSIS — D485 Neoplasm of uncertain behavior of skin: Secondary | ICD-10-CM | POA: Diagnosis not present

## 2017-10-26 DIAGNOSIS — Z85820 Personal history of malignant melanoma of skin: Secondary | ICD-10-CM | POA: Diagnosis not present

## 2017-10-26 DIAGNOSIS — D2271 Melanocytic nevi of right lower limb, including hip: Secondary | ICD-10-CM | POA: Diagnosis not present

## 2017-10-26 DIAGNOSIS — D225 Melanocytic nevi of trunk: Secondary | ICD-10-CM | POA: Diagnosis not present

## 2017-10-26 DIAGNOSIS — D2261 Melanocytic nevi of right upper limb, including shoulder: Secondary | ICD-10-CM | POA: Diagnosis not present

## 2017-10-26 DIAGNOSIS — D2272 Melanocytic nevi of left lower limb, including hip: Secondary | ICD-10-CM | POA: Diagnosis not present

## 2017-10-26 DIAGNOSIS — L438 Other lichen planus: Secondary | ICD-10-CM | POA: Diagnosis not present

## 2017-10-26 DIAGNOSIS — Z08 Encounter for follow-up examination after completed treatment for malignant neoplasm: Secondary | ICD-10-CM | POA: Diagnosis not present

## 2017-10-26 DIAGNOSIS — D2262 Melanocytic nevi of left upper limb, including shoulder: Secondary | ICD-10-CM | POA: Diagnosis not present

## 2017-10-26 DIAGNOSIS — Z85828 Personal history of other malignant neoplasm of skin: Secondary | ICD-10-CM | POA: Diagnosis not present

## 2017-10-26 DIAGNOSIS — C44722 Squamous cell carcinoma of skin of right lower limb, including hip: Secondary | ICD-10-CM | POA: Diagnosis not present

## 2017-11-17 ENCOUNTER — Telehealth: Payer: Self-pay

## 2017-11-17 NOTE — Telephone Encounter (Signed)
Spoke with patient she is coming in to see Joycelyn Schmid for acute visit tomorrow due to sickness

## 2017-11-17 NOTE — Telephone Encounter (Signed)
Copied from [place] [id number]. Topic: Appointment Scheduling - Scheduling Inquiry for Clinic >> Nov 17, 2017  8:13 AM Synthia Innocent wrote: Reason for CRM: Patient is scheduled for lab work tomorrow, is not feeling well has cough. Scheduled to see [facility] tomorrow. Wants to to know if she should get lab work while feeling ill. Requesting message to be sent back by [facility]

## 2017-11-18 ENCOUNTER — Other Ambulatory Visit: Payer: Medicare Other

## 2017-11-18 ENCOUNTER — Encounter: Payer: Self-pay | Admitting: Family

## 2017-11-18 ENCOUNTER — Ambulatory Visit (INDEPENDENT_AMBULATORY_CARE_PROVIDER_SITE_OTHER): Payer: Medicare Other | Admitting: Family

## 2017-11-18 VITALS — BP 144/78 | HR 78 | Temp 98.8°F | Resp 16 | Wt 136.2 lb

## 2017-11-18 DIAGNOSIS — J40 Bronchitis, not specified as acute or chronic: Secondary | ICD-10-CM | POA: Diagnosis not present

## 2017-11-18 DIAGNOSIS — R748 Abnormal levels of other serum enzymes: Secondary | ICD-10-CM

## 2017-11-18 DIAGNOSIS — M810 Age-related osteoporosis without current pathological fracture: Secondary | ICD-10-CM | POA: Diagnosis not present

## 2017-11-18 MED ORDER — HYDROCODONE-HOMATROPINE 5-1.5 MG/5ML PO SYRP: 5.0000 mL | ORAL_SOLUTION | Freq: Every evening | ORAL | 0 refills | Status: DC | PRN

## 2017-11-18 MED ORDER — AZITHROMYCIN 250 MG PO TABS: ORAL_TABLET | ORAL | 0 refills | Status: DC

## 2017-11-18 NOTE — Addendum Note (Signed)
Addended by: Arby Barrette on: 11/18/2017 11:14 AM   Modules accepted: Orders

## 2017-11-18 NOTE — Addendum Note (Signed)
Addended by: Arby Barrette on: 11/18/2017 11:15 AM   Modules accepted: Orders

## 2017-11-18 NOTE — Progress Notes (Signed)
Subjective:    Patient ID: Susan Stanley, female    DOB: [birth year], 67 y.o.   MRN: [id number]  CC: Susan Stanley is a 67 y.o. female who presents today for an acute visit.    HPI: CC: cough x 6 days, worsening.  Endorses chills and tactile fever 2 days ago, resolved. Has sore throat, sinus pressure.  No wheezing, SOB, vision changes.   Tried tussion dm with some relief.   Daughter and son - in law had cough as well.   HTN- no cp, sob. Compliant with medication.     Never smoker HISTORY:  Past Medical History:  Diagnosis Date  . 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 Chicken pox   . Hypertension    Past Surgical History:  Procedure Laterality Date  . APPENDECTOMY     Family History  Problem Relation Age of Onset  . Alcohol abuse Mother   . COPD Mother   . Alcohol abuse Father   . Cancer Father        lung  . Hypertension Father   . Alcohol abuse Sister   . Arthritis Paternal Grandmother   . Cancer Paternal Grandfather        lung    Allergies: Penicillin g Current Outpatient Medications on File Prior to Visit  Medication Sig Dispense Refill  . amLODipine (NORVASC) 2.5 MG tablet Take 1 tablet (2.5 mg total) by mouth daily. 90 tablet 2  . omeprazole (PRILOSEC) 20 MG capsule Take 1 capsule (20 mg total) by mouth at bedtime. 90 capsule 2   No current facility-administered medications on file prior to visit.     Social History   Tobacco Use  . Smoking status: Never Smoker  . Smokeless tobacco: Never Used  Substance Use Topics  . Alcohol use: No  . Drug use: No    Review of Systems  Constitutional: Negative for chills and fever.  HENT: Positive for congestion, sinus pressure and sore throat. Negative for ear pain and trouble swallowing.   Eyes: Negative for visual disturbance.  Respiratory: Positive for cough. Negative for shortness of breath and wheezing.   Cardiovascular: Negative for chest pain and palpitations.  Gastrointestinal: Negative for nausea and  vomiting.  Neurological: Negative for headaches.      Objective:    BP (!) 144/78 (BP Location: Right Arm, Patient Position: Sitting, Cuff Size: Normal)   Pulse 78   Temp 98.8 F (37.1 C) (Oral)   Resp 16   Wt 136 lb 4 oz (61.8 kg)   SpO2 98%   BMI 21.99 kg/m  BP Readings from Last 3 Encounters:  11/18/17 (!) 144/78  10/12/17 124/74  08/15/17 110/70     Physical Exam  Constitutional: She appears well-developed and well-nourished.  HENT:  Head: Normocephalic and atraumatic.  Right Ear: Hearing, tympanic membrane, external ear and ear canal normal. No drainage, swelling or tenderness. No foreign bodies. Tympanic membrane is not erythematous and not bulging. No middle ear effusion. No decreased hearing is noted.  Left Ear: Hearing, tympanic membrane, external ear and ear canal normal. No drainage, swelling or tenderness. No foreign bodies. Tympanic membrane is not erythematous and not bulging.  No middle ear effusion. No decreased hearing is noted.  Nose: Nose normal. No rhinorrhea. Right sinus exhibits no maxillary sinus tenderness and no frontal sinus tenderness. Left sinus exhibits no maxillary sinus tenderness and no frontal sinus tenderness.  Mouth/Throat: Uvula is midline, oropharynx is clear and moist and mucous membranes are  normal. No oropharyngeal exudate, posterior oropharyngeal edema, posterior oropharyngeal erythema or tonsillar abscesses.  Eyes: Conjunctivae are normal.  Cardiovascular: Regular rhythm, normal heart sounds and normal pulses.  Pulmonary/Chest: Effort normal and breath sounds normal. She has no wheezes. She has no rhonchi. She has no rales.  Lymphadenopathy:       Head (right side): No submental, no submandibular, no tonsillar, no preauricular, no posterior auricular and no occipital adenopathy present.       Head (left side): No submental, no submandibular, no tonsillar, no preauricular, no posterior auricular and no occipital adenopathy present.    She  has no cervical adenopathy.  Neurological: She is alert.  Skin: Skin is warm and dry.  Psychiatric: She has a normal mood and affect. Her speech is normal and behavior is normal. Thought content normal.  Vitals reviewed.      Assessment & Plan:    1. Elevated alkaline phosphatase level  - Comprehensive metabolic panel  2. Age-related osteoporosis without current pathological fracture  - Alkaline phosphatase, isoenzymes  3. Bronchitis- primary Patient well-appearing.  No acute respiratory distress.  Advised patient suspect more likely viral component.  It is Friday and patient does state symptoms have been worsening, in this context, we jointly agreed togive antibiotic rx.  She may wait another day or 2 to fill this and treat conservatively.  Patient let me know if she is no better. Note, typically blood pressure of good control ; suspect amount of coughing while in exam room contributory to elevation. Patient will monitor at home.   - HYDROcodone-homatropine [facility]) 5-1.5 MG/5ML syrup; Take 5 mLs by mouth at bedtime as needed for cough.  Dispense: 30 mL; Refill: 0 - azithromycin (ZITHROMAX) 250 MG tablet; Tale 500 mg PO on day 1, then 250 mg PO q24h x 4 days.  Dispense: 6 tablet; Refill: 0   I am having Susan Stanley start on HYDROcodone-homatropine and azithromycin. I am also having her maintain her amLODipine and omeprazole.   Meds ordered this encounter  Medications  . HYDROcodone-homatropine (HYCODAN) 5-1.5 MG/5ML syrup    Sig: Take 5 mLs by mouth at bedtime as needed for cough.    Dispense:  30 mL    Refill:  0    Order Specific Question:   Supervising Provider    Answer:   Derrel Nip, TERESA L [[id number]]  . azithromycin (ZITHROMAX) 250 MG tablet    Sig: Tale 500 mg PO on day 1, then 250 mg PO q24h x 4 days.    Dispense:  6 tablet    Refill:  0    Order Specific Question:   Supervising Provider    Answer:   Crecencio Mc [[id number]]    Return precautions given.   Risks,  benefits, and alternatives of the medications and treatment plan prescribed today were discussed, and patient expressed understanding.   Education regarding symptom management and diagnosis given to patient on AVS.  Continue to follow with Burnard Hawthorne, FNP for routine health maintenance.   Susan Stanley and I agreed with plan.   [name], FNP

## 2017-11-18 NOTE — Patient Instructions (Signed)
I suspect that your infection is viral in nature.  As discussed, I advise that you wait to fill the antibiotic after 1-2 days of symptom management to see if your symptoms improve. If you do not show improvement, you may take the antibiotic as prescribed.   Ensure to take probiotics while on antibiotics and also for 2 weeks after completion. It is important to re-colonize the gut with good bacteria and also to prevent any diarrheal infections associated with antibiotic use.   Please take cough medication at night only as needed. As we discussed, I do not recommend dosing throughout the day as coughing is a protective mechanism . It also helps to break up thick mucous.  Do not take cough suppressants with alcohol as can lead to trouble breathing. Advise caution if taking cough suppressant and operating machinery ( i.e driving a car) as you may feel very tired.      Let me know if not better!

## 2017-11-21 LAB — COMPREHENSIVE METABOLIC PANEL
ALK PHOS: 119 IU/L — AB (ref 39–117)
ALT: 9 IU/L (ref 0–32)
AST: 11 IU/L (ref 0–40)
Albumin/Globulin Ratio: 1.6 (ref 1.2–2.2)
Albumin: 4.5 g/dL (ref 3.6–4.8)
BUN/Creatinine Ratio: 17 (ref 12–28)
BUN: 12 mg/dL (ref 8–27)
CHLORIDE: 100 mmol/L (ref 96–106)
CO2: 22 mmol/L (ref 20–29)
Calcium: 9.6 mg/dL (ref 8.7–10.3)
Creatinine, Ser: 0.7 mg/dL (ref 0.57–1.00)
GFR calc non Af Amer: 89 mL/min/{1.73_m2} (ref >59)
GFR, EST AFRICAN AMERICAN: 102 mL/min/{1.73_m2} (ref >59)
GLUCOSE: 124 mg/dL — AB (ref 65–99)
Globulin, Total: 2.8 g/dL (ref 1.5–4.5)
Potassium: 4.3 mmol/L (ref 3.5–5.2)
Sodium: 138 mmol/L (ref 134–144)
Total Protein: 7.3 g/dL (ref 6.0–8.5)

## 2017-11-21 LAB — ALKALINE PHOSPHATASE, ISOENZYMES
BONE FRACTION: 36 % (ref 14–68)
INTESTINAL FRAC.: 0 % (ref 0–18)
LIVER FRACTION: 64 % (ref 18–85)

## 2017-11-25 ENCOUNTER — Encounter: Payer: Self-pay | Admitting: Family

## 2017-11-25 ENCOUNTER — Other Ambulatory Visit: Payer: Self-pay | Admitting: Family

## 2017-11-25 DIAGNOSIS — R748 Abnormal levels of other serum enzymes: Secondary | ICD-10-CM

## 2017-11-28 NOTE — Telephone Encounter (Signed)
Called patient and she states she wants to hold off on checking alkaline phosphatase , she has been reducing gluten in diet. She will call back to schedule lab appointment.

## 2017-11-28 NOTE — Telephone Encounter (Signed)
Copied from [place] [id number]. Topic: Quick Communication - See Telephone Encounter >> Nov 28, 2017 10:14 AM Percell Belt A wrote: CRM for notification. See Telephone encounter for: 11/28/17. Pt called in about her mychart message that she sent.  She has a few question for Arnette   Best number is [phone]

## 2017-12-07 ENCOUNTER — Ambulatory Visit
Admission: RE | Admit: 2017-12-07 | Discharge: 2017-12-07 | Disposition: A | Discharge status: Home / Self Care | Payer: Medicare Other | Source: Ambulatory Visit | Attending: Family | Admitting: Family

## 2017-12-07 DIAGNOSIS — M8589 Other specified disorders of bone density and structure, multiple sites: Secondary | ICD-10-CM | POA: Diagnosis not present

## 2017-12-07 DIAGNOSIS — Z780 Asymptomatic menopausal state: Secondary | ICD-10-CM | POA: Diagnosis not present

## 2017-12-07 DIAGNOSIS — M810 Age-related osteoporosis without current pathological fracture: Secondary | ICD-10-CM | POA: Insufficient documentation

## 2017-12-13 ENCOUNTER — Encounter: Payer: Self-pay | Admitting: Family

## 2017-12-14 DIAGNOSIS — Z85828 Personal history of other malignant neoplasm of skin: Secondary | ICD-10-CM | POA: Diagnosis not present

## 2017-12-14 DIAGNOSIS — L905 Scar conditions and fibrosis of skin: Secondary | ICD-10-CM | POA: Diagnosis not present

## 2017-12-14 DIAGNOSIS — L578 Other skin changes due to chronic exposure to nonionizing radiation: Secondary | ICD-10-CM | POA: Diagnosis not present

## 2017-12-14 DIAGNOSIS — C434 Malignant melanoma of scalp and neck: Secondary | ICD-10-CM | POA: Diagnosis not present

## 2017-12-20 ENCOUNTER — Telehealth: Payer: Self-pay | Admitting: *Deleted

## 2017-12-20 DIAGNOSIS — C44729 Squamous cell carcinoma of skin of left lower limb, including hip: Secondary | ICD-10-CM | POA: Diagnosis not present

## 2017-12-20 NOTE — Telephone Encounter (Signed)
Copied from [place] [id number]. Topic: Inquiry >> Dec 20, 2017 11:24 AM Susan Stanley, NT wrote: Reason for CRM: patient states she was supposed to have a ultrasound of her liver and she canceled that appointment but she would like to reschedule that. Please advise.

## 2017-12-21 ENCOUNTER — Encounter (INDEPENDENT_AMBULATORY_CARE_PROVIDER_SITE_OTHER): Payer: Self-pay

## 2017-12-21 NOTE — Telephone Encounter (Signed)
Message sent to pt with phone number to reschedule appt

## 2018-01-04 ENCOUNTER — Ambulatory Visit
Admission: RE | Admit: 2018-01-04 | Discharge: 2018-01-04 | Disposition: A | Discharge status: Home / Self Care | Payer: Medicare Other | Source: Ambulatory Visit | Attending: Family | Admitting: Family

## 2018-01-04 DIAGNOSIS — R932 Abnormal findings on diagnostic imaging of liver and biliary tract: Secondary | ICD-10-CM | POA: Insufficient documentation

## 2018-01-04 DIAGNOSIS — R748 Abnormal levels of other serum enzymes: Secondary | ICD-10-CM | POA: Diagnosis not present

## 2018-01-04 DIAGNOSIS — K7689 Other specified diseases of liver: Secondary | ICD-10-CM | POA: Diagnosis not present

## 2018-01-09 ENCOUNTER — Encounter: Payer: Self-pay | Admitting: Family

## 2018-01-10 ENCOUNTER — Encounter: Payer: Self-pay | Admitting: Family

## 2018-01-10 ENCOUNTER — Telehealth: Payer: Self-pay

## 2018-01-10 DIAGNOSIS — K760 Fatty (change of) liver, not elsewhere classified: Secondary | ICD-10-CM | POA: Insufficient documentation

## 2018-01-10 NOTE — Telephone Encounter (Signed)
Copied from [place] [id number]. Topic: Quick Communication - Other Results >> Jan 10, 2018  9:22 AM Lennox Solders wrote: Liver ultrasound results

## 2018-01-10 NOTE — Telephone Encounter (Signed)
Advised you were out of office

## 2018-01-11 ENCOUNTER — Encounter: Payer: Self-pay | Admitting: Family

## 2018-01-16 ENCOUNTER — Other Ambulatory Visit: Payer: Self-pay | Admitting: Family

## 2018-01-16 ENCOUNTER — Ambulatory Visit: Payer: Medicare Other

## 2018-01-16 DIAGNOSIS — K760 Fatty (change of) liver, not elsewhere classified: Secondary | ICD-10-CM

## 2018-01-16 DIAGNOSIS — K900 Celiac disease: Secondary | ICD-10-CM

## 2018-01-18 ENCOUNTER — Ambulatory Visit: Payer: Medicare Other | Admitting: Family

## 2018-01-19 IMAGING — MG MM DIGITAL SCREENING BILAT W/ TOMO W/ CAD
9 of 13 series · 9 of 29 positions shown · non-contrast
Comparison: None.

CLINICAL DATA: Screening.

EXAM:
2D DIGITAL SCREENING BILATERAL MAMMOGRAM WITH CAD AND ADJUNCT TOMO

[R MLO (1 of 2)]
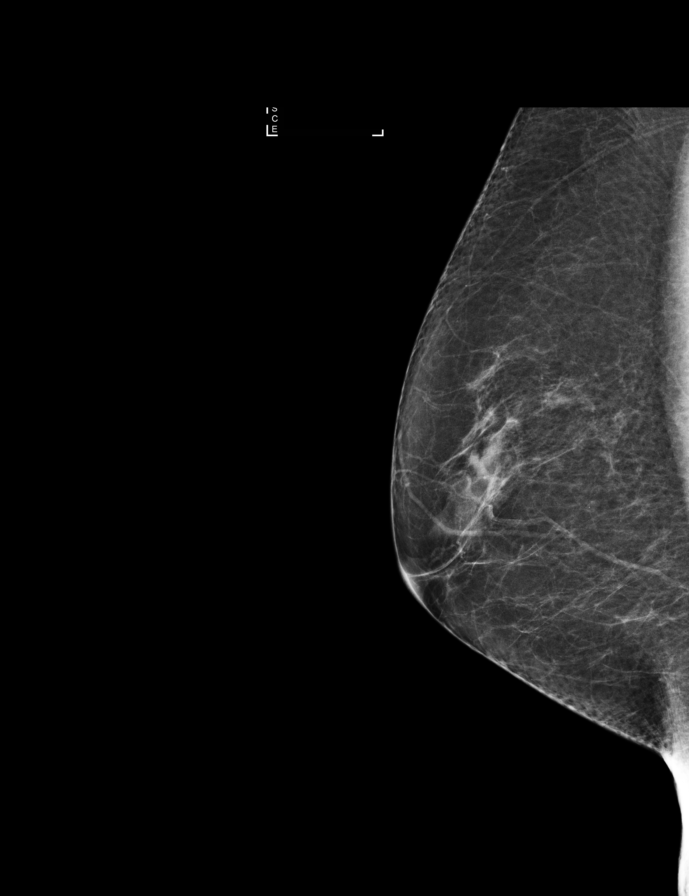

[L MLO]
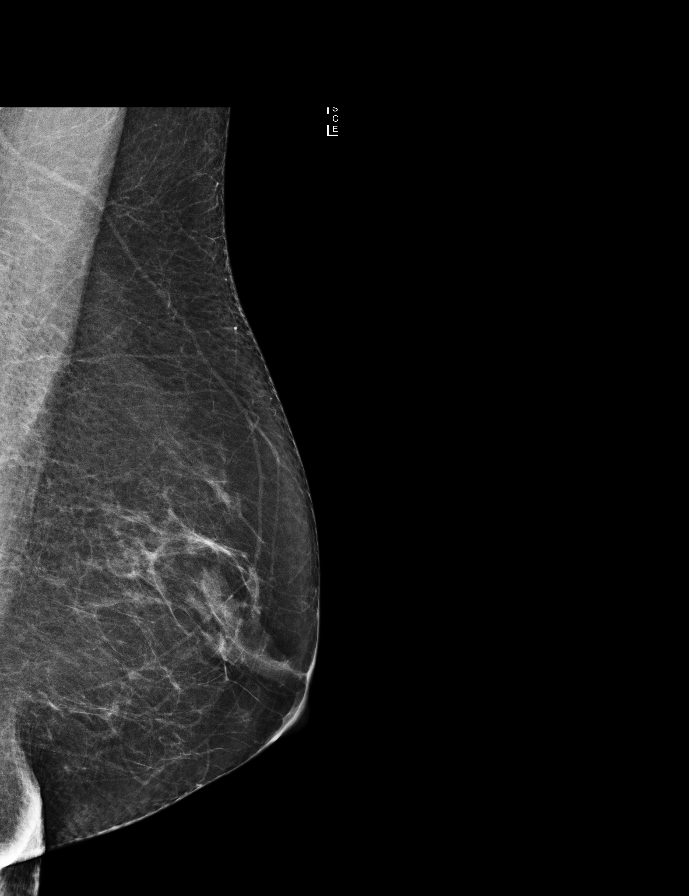

[R CC synth-2D]
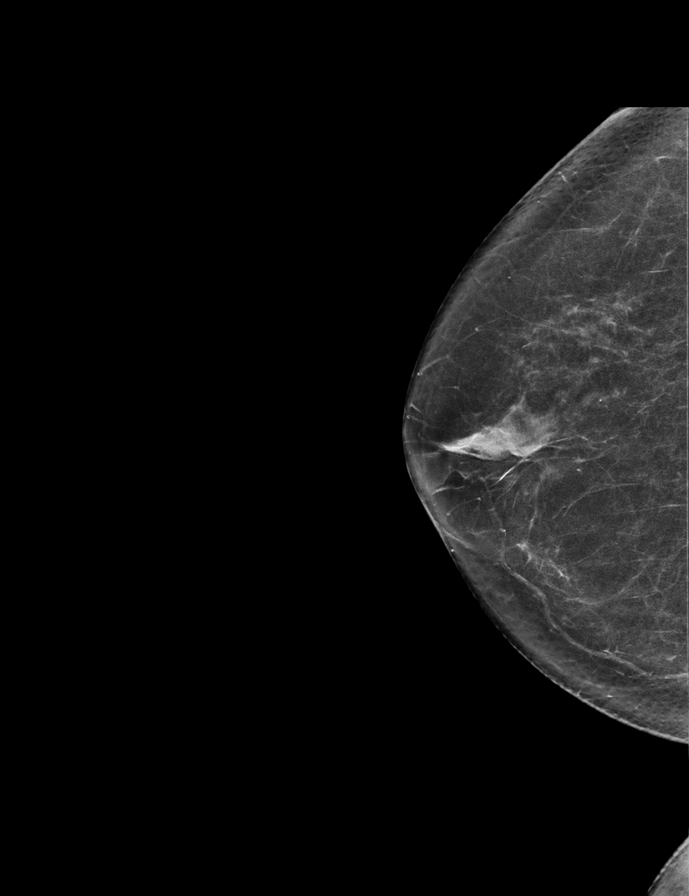

[L CC]
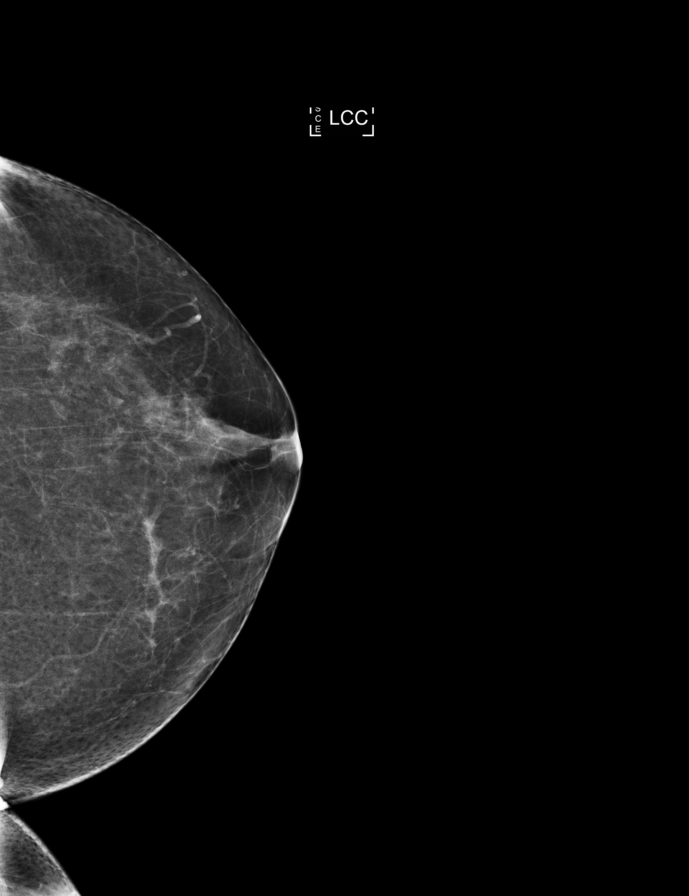

[R CC]
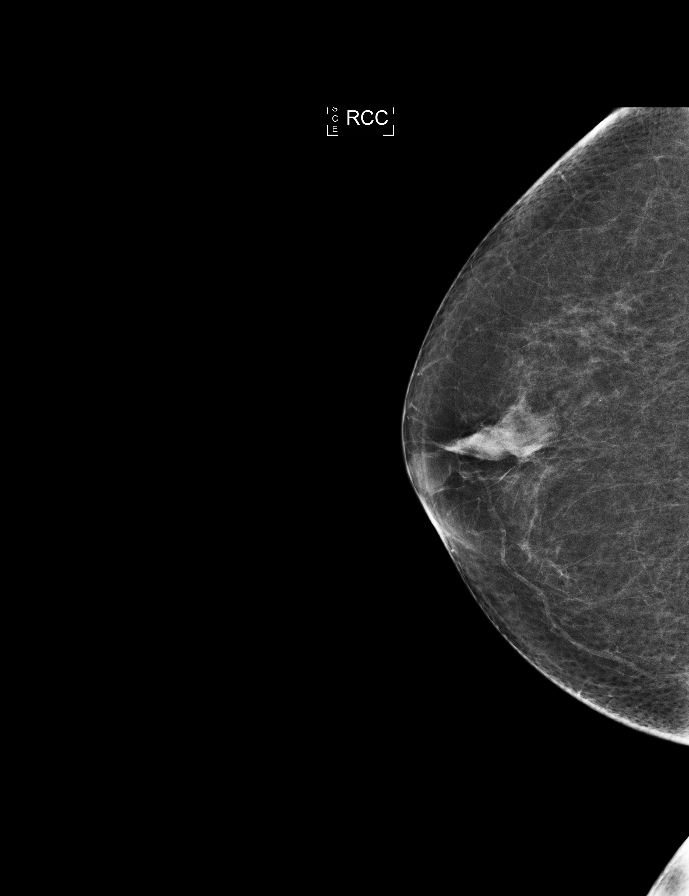

[L CC synth-2D]
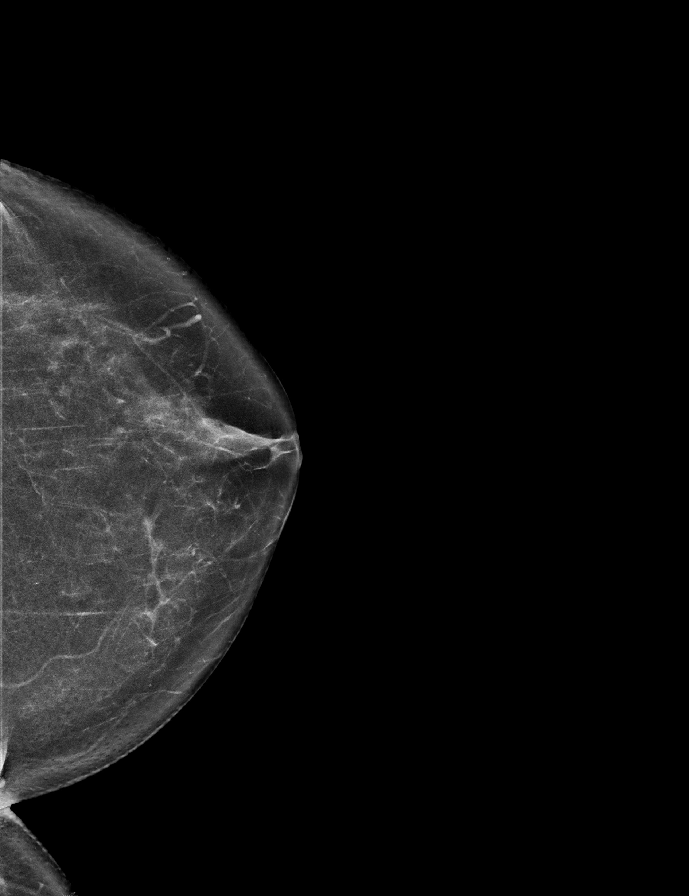

[R MLO (2 of 2)]
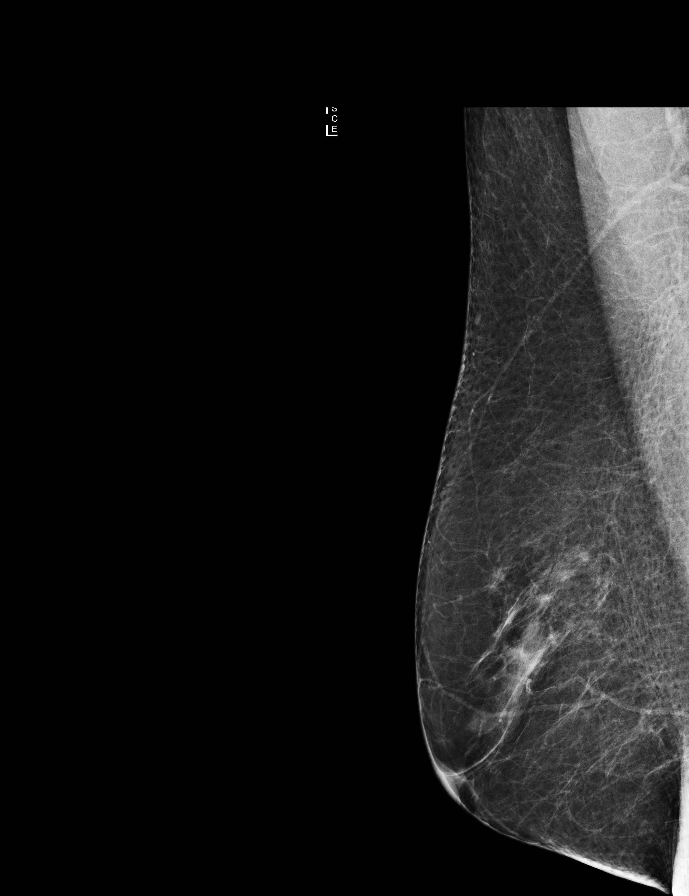

[R MLO synth-2D]
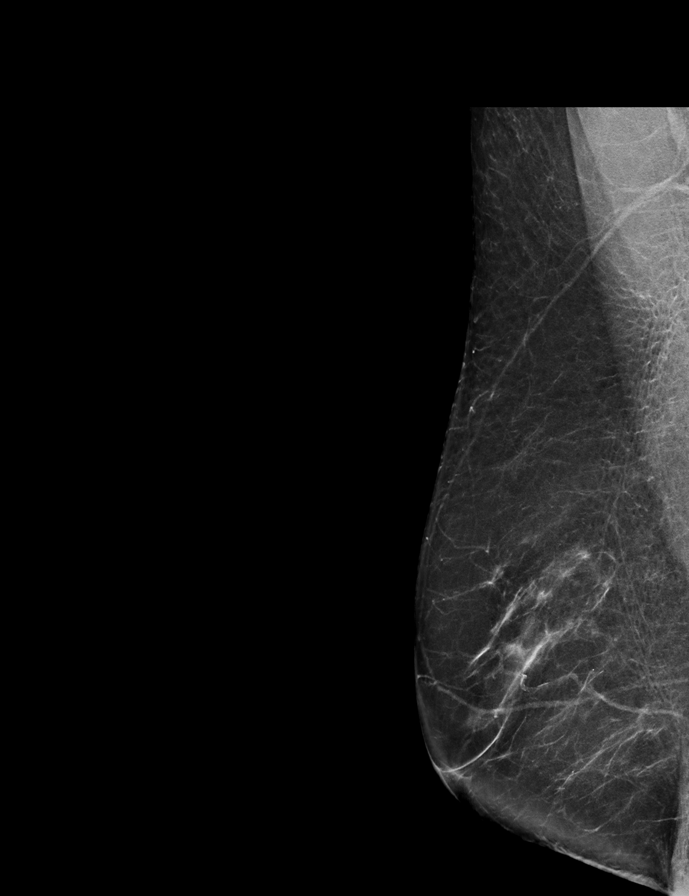

[L MLO synth-2D]
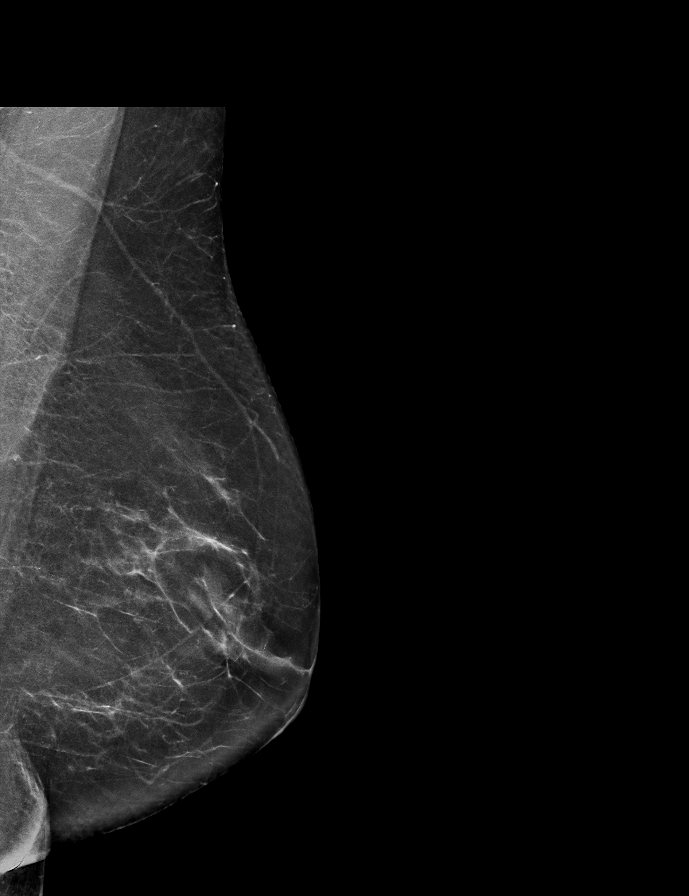

[9 of 29 positions shown; findings below may reference images not displayed]

ACR Breast Density Category b: There are scattered areas of
fibroglandular density.
FINDINGS: There are no findings suspicious for malignancy. Images were
processed with CAD.
IMPRESSION: No mammographic evidence of malignancy. A result letter of this
screening mammogram will be mailed directly to the patient.

RECOMMENDATION:
Screening mammogram in one year. (Code:[id number])

BI-RADS CATEGORY  1: Negative.

## 2018-01-30 ENCOUNTER — Telehealth: Payer: Self-pay | Admitting: Radiology

## 2018-01-30 DIAGNOSIS — M810 Age-related osteoporosis without current pathological fracture: Secondary | ICD-10-CM

## 2018-01-30 NOTE — Telephone Encounter (Signed)
Pt coming in for labs tomorrow, please place future orders. Thank you.  

## 2018-01-30 NOTE — Addendum Note (Signed)
Addended by: Burnard Hawthorne on: 01/30/2018 10:03 AM   Modules accepted: Orders

## 2018-01-30 NOTE — Telephone Encounter (Signed)
Call pt  Nonfasting labs placed for tomorrow  I ordered 2 labs only- vitamin D and metabolic panel to follow her liver enzymes.   I didn't see any other labs needed at this time unless patient had other request

## 2018-01-31 ENCOUNTER — Other Ambulatory Visit (INDEPENDENT_AMBULATORY_CARE_PROVIDER_SITE_OTHER): Payer: Medicare Other

## 2018-01-31 DIAGNOSIS — M810 Age-related osteoporosis without current pathological fracture: Secondary | ICD-10-CM

## 2018-01-31 LAB — COMPREHENSIVE METABOLIC PANEL
ALK PHOS: 87 U/L (ref 39–117)
ALT: 14 U/L (ref 0–35)
AST: 15 U/L (ref 0–37)
Albumin: 4.4 g/dL (ref 3.5–5.2)
BUN: 12 mg/dL (ref 6–23)
CO2: 28 meq/L (ref 19–32)
Calcium: 9.5 mg/dL (ref 8.4–10.5)
Chloride: 103 mEq/L (ref 96–112)
Creatinine, Ser: 0.7 mg/dL (ref 0.40–1.20)
GFR: 88.03 mL/min (ref >60.00)
GLUCOSE: 103 mg/dL — AB (ref 70–99)
POTASSIUM: 3.7 meq/L (ref 3.5–5.1)
Sodium: 138 mEq/L (ref 135–145)
Total Bilirubin: 0.4 mg/dL (ref 0.2–1.2)
Total Protein: 7.7 g/dL (ref 6.0–8.3)

## 2018-01-31 LAB — VITAMIN D 25 HYDROXY (VIT D DEFICIENCY, FRACTURES): VITD: 24.06 ng/mL — AB (ref 30.00–100.00)

## 2018-02-01 ENCOUNTER — Other Ambulatory Visit: Payer: Self-pay | Admitting: Family

## 2018-02-01 ENCOUNTER — Encounter: Payer: Self-pay | Admitting: Family

## 2018-02-01 DIAGNOSIS — E559 Vitamin D deficiency, unspecified: Secondary | ICD-10-CM

## 2018-02-01 MED ORDER — CHOLECALCIFEROL 1.25 MG (50000 UT) PO TABS: ORAL_TABLET | ORAL | 0 refills | Status: DC

## 2018-02-01 NOTE — Progress Notes (Signed)
close

## 2018-02-16 ENCOUNTER — Ambulatory Visit: Payer: Medicare Other

## 2018-03-03 ENCOUNTER — Encounter: Payer: Self-pay | Admitting: Obstetrics and Gynecology

## 2018-03-03 ENCOUNTER — Ambulatory Visit (INDEPENDENT_AMBULATORY_CARE_PROVIDER_SITE_OTHER): Payer: Medicare Other | Admitting: Obstetrics and Gynecology

## 2018-03-03 VITALS — BP 124/72 | HR 74 | Ht 66.0 in | Wt 131.0 lb

## 2018-03-03 DIAGNOSIS — Z1231 Encounter for screening mammogram for malignant neoplasm of breast: Secondary | ICD-10-CM | POA: Diagnosis not present

## 2018-03-03 DIAGNOSIS — Z01419 Encounter for gynecological examination (general) (routine) without abnormal findings: Secondary | ICD-10-CM | POA: Diagnosis not present

## 2018-03-03 DIAGNOSIS — Z1239 Encounter for other screening for malignant neoplasm of breast: Secondary | ICD-10-CM

## 2018-03-03 NOTE — Progress Notes (Signed)
Gynecology Annual Exam  PCP: Burnard Hawthorne, FNP  Chief Complaint:  Chief Complaint  Patient presents with  . Gynecologic Exam    History of Present Illness:Patient is a 67 y.o. Susan Stanley presents for annual exam. The patient has no complaints today.   LMP: No LMP recorded. Patient is postmenopausal. No postmenopausal bleeding  The patient does perform self breast exams.  There is no notable family history of breast or ovarian cancer in her family.  The patient wears seatbelts: yes.   The patient has regular exercise: not asked.    The patient denies current symptoms of depression.     Review of Systems: Review of Systems  Constitutional: Negative for chills and fever.  HENT: Negative for congestion.   Respiratory: Negative for cough and shortness of breath.   Cardiovascular: Negative for chest pain and palpitations.  Gastrointestinal: Negative for abdominal pain, constipation, diarrhea, heartburn, nausea and vomiting.  Genitourinary: Negative for dysuria, frequency and urgency.  Skin: Negative for itching and rash.  Neurological: Negative for dizziness and headaches.  Endo/Heme/Allergies: Negative for polydipsia.  Psychiatric/Behavioral: Negative for depression.    Past Medical History:  Past Medical History:  Diagnosis Date  . Cancer ([place])    atypical melanoma  . Chicken pox   . Hypertension     Past Surgical History:  Past Surgical History:  Procedure Laterality Date  . APPENDECTOMY    . melanoma removal  2011   top of head    Gynecologic History:  No LMP recorded. Patient is postmenopausal. Last mammogram: 2018 Results were: BI-RAD I  Obstetric History: [id number]  Family History:  Family History  Problem Relation Age of Onset  . Alcohol abuse Mother   . COPD Mother   . Alcohol abuse Father   . Cancer Father        lung  . Hypertension Father   . Alcohol abuse Sister   . Arthritis Paternal Grandmother   . Cancer Paternal Grandfather     Colon    Social History:  Social History   Socioeconomic History  . Marital status: Divorced    Spouse name: Not on file  . Number of children: Not on file  . Years of education: Not on file  . Highest education level: Not on file  Occupational History  . Not on file  Social Needs  . Financial resource strain: Not on file  . Food insecurity:    Worry: Never true    Inability: Never true  . Transportation needs:    Medical: No    Non-medical: No  Tobacco Use  . Smoking status: Never Smoker  . Smokeless tobacco: Never Used  Substance and Sexual Activity  . Alcohol use: No  . Drug use: No  . Sexual activity: Not Currently    Birth control/protection: Post-menopausal  Lifestyle  . Physical activity:    Days per week: Not on file    Minutes per session: Not on file  . Stress: Not on file  Relationships  . Social connections:    Talks on phone: Not on file    Gets together: Not on file    Attends religious service: Not on file    Active member of club or organization: Not on file    Attends meetings of clubs or organizations: Not on file    Relationship status: Not on file  . Intimate partner violence:    Fear of current or ex partner: No    Emotionally abused: No  Physically abused: No    Forced sexual activity: No  Other Topics Concern  . Not on file  Social History Narrative   Daughter is Anson Fret      From [place]             Allergies:  Allergies  Allergen Reactions  . Penicillin G     Other reaction(s): Unknown Pt had a reaction when she was a baby but doesn't know the reaction or severity.    Medications: Prior to Admission medications   Medication Sig Start Date End Date Taking? Authorizing Provider  amLODipine (NORVASC) 2.5 MG tablet Take 1 tablet (2.5 mg total) by mouth daily. 08/15/17  Yes Burnard Hawthorne, FNP  Cholecalciferol 50000 units TABS 50,000 units PO qwk for 8 weeks. 02/01/18  Yes Burnard Hawthorne, FNP    Physical  Exam Vitals: Blood pressure 124/72, pulse 74, height 5\' 6"  (1.676 m), weight 131 lb (59.4 kg).  General: NAD HEENT: normocephalic, anicteric Thyroid: no enlargement, no palpable nodules Pulmonary: No increased work of breathing, CTAB Cardiovascular: RRR, distal pulses 2+ Breast: Breast symmetrical, no tenderness, no palpable nodules or masses, no skin or nipple retraction present, no nipple discharge.  No axillary or supraclavicular lymphadenopathy. Abdomen: NABS, soft, non-tender, non-distended.  Umbilicus without lesions.  No hepatomegaly, splenomegaly or masses palpable. No evidence of hernia  Genitourinary:  External: Normal external female genitalia.  Normal urethral meatus, normal Bartholin's and Skene's glands.    Vagina: Normal vaginal mucosa, no evidence of prolapse.    Cervix: Grossly normal in appearance, no bleeding  Uterus: Non-enlarged, mobile, normal contour.  No CMT  Adnexa: ovaries non-enlarged, no adnexal masses  Rectal: deferred  Lymphatic: no evidence of inguinal lymphadenopathy Extremities: no edema, erythema, or tenderness Neurologic: Grossly intact Psychiatric: mood appropriate, affect full  Female chaperone present for pelvic and breast  portions of the physical exam     Assessment: 67 y.o. [id number] routine annual exam  Plan: Problem List Items Addressed This Visit    None    Visit Diagnoses    Breast screening    -  Primary   Relevant Orders   MM 3D SCREEN BREAST BILATERAL   Encounter for gynecological examination without abnormal finding          1) Mammogram - recommend yearly screening mammogram.  Mammogram Was ordered today  2) STI screening  was notoffered and therefore not obtained  3) ASCCP guidelines and rational discussed.  Patient opts for discontinue age >64 screening interval  4) Osteoporosis  - per USPTF routine screening DEXA at age 60  5) Routine healthcare maintenance including cholesterol, diabetes screening discussed managed  by PCP  6) Colonoscopy - UTD  7) Gloris Manchester mom  8) Return in about 1 year (around 03/04/2019) for annual.    Malachy Mood, MD Mosetta Pigeon, [facility] 03/03/2018, 9:45 AM

## 2018-03-03 NOTE — Patient Instructions (Signed)
[facility] [address] [place] [address]  [facility]  [address] [place] [address]  Phone: [phone]  

## 2018-03-06 ENCOUNTER — Other Ambulatory Visit: Payer: Self-pay

## 2018-03-06 ENCOUNTER — Ambulatory Visit (INDEPENDENT_AMBULATORY_CARE_PROVIDER_SITE_OTHER): Payer: Medicare Other | Admitting: Gastroenterology

## 2018-03-06 ENCOUNTER — Encounter: Payer: Self-pay | Admitting: Gastroenterology

## 2018-03-06 VITALS — BP 180/93 | HR 90 | Resp 18 | Ht 66.0 in | Wt 130.6 lb

## 2018-03-06 DIAGNOSIS — D128 Benign neoplasm of rectum: Secondary | ICD-10-CM

## 2018-03-06 DIAGNOSIS — K900 Celiac disease: Secondary | ICD-10-CM

## 2018-03-06 DIAGNOSIS — K760 Fatty (change of) liver, not elsewhere classified: Secondary | ICD-10-CM

## 2018-03-06 NOTE — Progress Notes (Signed)
Cephas Darby, MD [address]  [place]  [place], [place] [address]  Main: [phone]  Fax: [phone]    Gastroenterology Consultation  Referring Provider:     Burnard Hawthorne, FNP Primary Care Physician:  Burnard Hawthorne, FNP Primary Gastroenterologist:  Dr. Cephas Darby Reason for Consultation:     Celiac disease, fatty liver disease, colon adenoma        HPI:   Susan Stanley is a 67 y.o. Caucasian female referred by Dr. Vidal Schwalbe, Yvetta Coder, FNP  for consultation & management of history of celiac disease, fatty liver, colon polyp Patient reports that she was diagnosed with celiac disease in 2007 based on the blood test and endoscopy as she was experiencing bloating, diarrhea.  She went on a gluten-free diet as recommended, also consulted nutritionist during that time.  Her symptoms resolved on a gluten-free diet.  She moved from [place] about 1 and half years ago.  She had liver test performed as part of physical on 10/12/2017, found to have isolated elevated alkaline phosphatase to 149, ultrasound revealed mild hepatic steatosis.  She researched online and found association between celiac disease and elevated LFTs.  Hep C antibody was negative.  She said she was cheating on a gluten-free diet prior to this.  She then went on a strict gluten-free diet, that lead to some weight loss.  Her LFTs returned to normal. She was also found to have tubular adenoma in the rectum in 05/2014 She was taking omeprazole for heartburn which she recently stopped.  Currently, taking apple cider vinegar with garlic as needed for reflux symptoms.  She denies any GI symptoms today She does not drink alcohol or smoke cigarettes   NSAIDs: None  Antiplts/Anticoagulants/Anti thrombotics: None  GI Procedures: EGD and colonoscopy in 05/2014 in [place] Esophageal, gastric and duodenal biopsies were unremarkable Colonoscopy revealed a 12 mm polyp in the rectum that was removed with hot  snare Pathology showed tubular adenoma only Bowel prep was adequate per report She denies any family history of GI malignancy  Past Medical History:  Diagnosis Date  . Cancer ([place])    atypical melanoma  . Chicken pox   . Hypertension     Past Surgical History:  Procedure Laterality Date  . APPENDECTOMY    . melanoma removal  2011   top of head     Current Outpatient Medications:  .  amLODipine (NORVASC) 2.5 MG tablet, Take 1 tablet (2.5 mg total) by mouth daily., Disp: 90 tablet, Rfl: 2 .  Calcium Carbonate-Vitamin D (CALCIUM HIGH POTENCY/VITAMIN D) 600-200 MG-UNIT TABS, Take by mouth., Disp: , Rfl:  .  Cholecalciferol (VITAMIN D3) 50000 units CAPS, TK 1 C PO Q WK FOR 8 WEEKS, Disp: , Rfl: 0 .  Cholecalciferol 50000 units TABS, 50,000 units PO qwk for 8 weeks., Disp: 8 tablet, Rfl: 0 .  Omega-3 Fatty Acids (FISH OIL) 1000 MG CAPS, Take by mouth., Disp: , Rfl:   Family History  Problem Relation Age of Onset  . Alcohol abuse Mother   . COPD Mother   . Alcohol abuse Father   . Cancer Father        lung  . Hypertension Father   . Alcohol abuse Sister   . Arthritis Paternal Grandmother   . Cancer Paternal Grandfather        Colon     Social History   Tobacco Use  . Smoking status: Never Smoker  . Smokeless tobacco: Never Used  Substance Use Topics  . Alcohol use: No  . Drug use: No    Allergies as of 03/06/2018 - Review Complete 03/06/2018  Allergen Reaction Noted  . Penicillin g  01/20/2017    Review of Systems:    All systems reviewed and negative except where noted in HPI.   Physical Exam:  BP (!) 180/93 (BP Location: Left Arm, Patient Position: Sitting, Cuff Size: Normal)   Pulse 90   Resp 18   Ht 5\' 6"  (1.676 m)   Wt 130 lb 9.6 oz (59.2 kg)   BMI 21.08 kg/m  No LMP recorded. Patient is postmenopausal.  General:   Alert,  Well-developed, well-nourished, pleasant and cooperative in NAD Head:  Normocephalic and atraumatic. Eyes:  Sclera clear, no  icterus.   Conjunctiva pink. Ears:  Normal auditory acuity. Nose:  No deformity, discharge, or lesions. Mouth:  No deformity or lesions,oropharynx pink & moist. Neck:  Supple; no masses or thyromegaly. Lungs:  Respirations even and unlabored.  Clear throughout to auscultation.   No wheezes, crackles, or rhonchi. No acute distress. Heart:  Regular rate and rhythm; no murmurs, clicks, rubs, or gallops. Abdomen:  Normal bowel sounds. Soft, non-tender and non-distended without masses, hepatosplenomegaly or hernias noted.  No guarding or rebound tenderness.   Rectal: Not performed Msk:  Symmetrical without gross deformities. Good, equal movement & strength bilaterally. Pulses:  Normal pulses noted. Extremities:  No clubbing or edema.  No cyanosis. Neurologic:  Alert and oriented x3;  grossly normal neurologically. Skin:  Intact without significant lesions or rashes. No jaundice. Psych:  Alert and cooperative. Normal mood and affect.  Imaging Studies: Reviewed  Assessment and Plan:   Susan Stanley is a 67 y.o. Caucasian female with reported history of celiac disease, currently in endoscopy and clinical remission on a gluten-free diet, mild fatty liver, tubular adenoma of the colon Celiac disease: Endoscopic remission based on duodenal biopsies in 05/2014 Continue gluten-free diet  Hepatic steatosis and isolated elevated alkaline phosphatase There is certainly an association between celiac disease and elevated LFTs LFTs are now normal on strict gluten-free diet Hep C antibody negative No further work-up needed at this time Continue healthy lifestyle, patient is reassured  Tubular adenoma: Repeat colonoscopy in 2020 Patient will call and schedule the procedure Discussed with her about various bowel prep options  Follow up as needed   Cephas Darby, MD

## 2018-03-27 DIAGNOSIS — Z08 Encounter for follow-up examination after completed treatment for malignant neoplasm: Secondary | ICD-10-CM | POA: Diagnosis not present

## 2018-03-27 DIAGNOSIS — D485 Neoplasm of uncertain behavior of skin: Secondary | ICD-10-CM | POA: Diagnosis not present

## 2018-03-27 DIAGNOSIS — L400 Psoriasis vulgaris: Secondary | ICD-10-CM | POA: Diagnosis not present

## 2018-03-27 DIAGNOSIS — Z85828 Personal history of other malignant neoplasm of skin: Secondary | ICD-10-CM | POA: Diagnosis not present

## 2018-03-27 DIAGNOSIS — Z85820 Personal history of malignant melanoma of skin: Secondary | ICD-10-CM | POA: Diagnosis not present

## 2018-03-27 DIAGNOSIS — L570 Actinic keratosis: Secondary | ICD-10-CM | POA: Diagnosis not present

## 2018-05-01 ENCOUNTER — Other Ambulatory Visit: Payer: Self-pay | Admitting: Family

## 2018-05-01 ENCOUNTER — Telehealth: Payer: Self-pay | Admitting: Family

## 2018-05-01 DIAGNOSIS — I10 Essential (primary) hypertension: Secondary | ICD-10-CM

## 2018-05-01 NOTE — Telephone Encounter (Signed)
Copied from [place] [id number]. Topic: Quick Communication - Rx Refill/Question >> May 01, 2018 12:11 PM Alfredia Ferguson R wrote: Medication: amLODipine (NORVASC) 2.5 MG tablet  Has the patient contacted their pharmacy?Yes  Preferred Pharmacy (with phone number or street name): [id number] - [place], [place] - [place] [phone] (Phone) [phone] (Fax)    Agent: Please be advised that RX refills may take up to 3 business days. We ask that you follow-up with your pharmacy.

## 2018-05-02 NOTE — Telephone Encounter (Signed)
Already ordered in another encounter on 05/01/18.

## 2018-06-23 ENCOUNTER — Encounter: Payer: Self-pay | Admitting: Family

## 2018-06-23 ENCOUNTER — Ambulatory Visit (INDEPENDENT_AMBULATORY_CARE_PROVIDER_SITE_OTHER): Payer: Medicare Other | Admitting: Family

## 2018-06-23 VITALS — BP 130/78 | HR 82 | Temp 98.2°F | Wt 131.4 lb

## 2018-06-23 DIAGNOSIS — R0981 Nasal congestion: Secondary | ICD-10-CM | POA: Diagnosis not present

## 2018-06-23 DIAGNOSIS — I10 Essential (primary) hypertension: Secondary | ICD-10-CM

## 2018-06-23 MED ORDER — DOXYCYCLINE HYCLATE 100 MG PO TABS: 100.0000 mg | ORAL_TABLET | Freq: Two times a day (BID) | ORAL | 0 refills | Status: DC

## 2018-06-23 NOTE — Progress Notes (Signed)
Subjective:    Patient ID: Susan Stanley, female    DOB: [birth year], 68 y.o.   MRN: [id number]  CC: Susan Stanley is a 68 y.o. female who presents today for an acute visit.    HPI: CC: sinus pressure, frontal HA, thick nasal congestion x one week, unchanged.   Concerned for sinus infection. Not worse HA of life.  No cough, wheezing, fever, vision changed. HA is not positional.    Tried mucinex, rhinocort without resolve.   HTN - compliant with medication. No cp, sob  H/o celiac- folllows with [name]   HISTORY:  Past Medical History:  Diagnosis Date  . 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 Chicken pox   . Hypertension    Past Surgical History:  Procedure Laterality Date  . APPENDECTOMY    . melanoma removal  2011   top of head   Family History  Problem Relation Age of Onset  . Alcohol abuse Mother   . COPD Mother   . Alcohol abuse Father   . Cancer Father        lung  . Hypertension Father   . Alcohol abuse Sister   . Arthritis Paternal Grandmother   . Cancer Paternal Grandfather        Colon    Allergies: Penicillin g Current Outpatient Medications on File Prior to Visit  Medication Sig Dispense Refill  . amLODipine (NORVASC) 2.5 MG tablet TAKE 1 TABLET(2.5 MG) BY MOUTH DAILY 90 tablet 0  . Calcium Carbonate-Vitamin D (CALCIUM HIGH POTENCY/VITAMIN D) 600-200 MG-UNIT TABS Take by mouth.    . Cholecalciferol (VITAMIN D3) 50000 units CAPS TK 1 C PO Q WK FOR 8 WEEKS  0  . Cholecalciferol 50000 units TABS 50,000 units PO qwk for 8 weeks. 8 tablet 0  . Omega-3 Fatty Acids (FISH OIL) 1000 MG CAPS Take by mouth.     No current facility-administered medications on file prior to visit.     Social History   Tobacco Use  . Smoking status: Never Smoker  . Smokeless tobacco: Never Used  Substance Use Topics  . Alcohol use: No  . Drug use: No    Review of Systems  Constitutional: Negative for chills and fever.  HENT: Positive for congestion, sinus pressure and sinus  pain.   Eyes: Negative for visual disturbance.  Respiratory: Negative for cough, shortness of breath and wheezing.   Cardiovascular: Negative for chest pain and palpitations.  Gastrointestinal: Negative for nausea and vomiting.  Neurological: Positive for headaches. Negative for dizziness.      Objective:    BP 130/78 (BP Location: Left Arm, Patient Position: Sitting, Cuff Size: Normal)   Pulse 82   Temp 98.2 F (36.8 C)   Wt 131 lb 6.4 oz (59.6 kg)   SpO2 98%   BMI 21.21 kg/m  BP Readings from Last 3 Encounters:  06/23/18 130/78  03/06/18 (!) 180/93  03/03/18 124/72     Physical Exam Vitals signs reviewed.  Constitutional:      Appearance: She is well-developed.  HENT:     Head: Normocephalic and atraumatic.     Right Ear: Hearing, tympanic membrane, ear canal and external ear normal. No decreased hearing noted. No drainage, swelling or tenderness. No middle ear effusion. No foreign body. Tympanic membrane is not erythematous or bulging.     Left Ear: Hearing, tympanic membrane, ear canal and external ear normal. No decreased hearing noted. No drainage, swelling or tenderness.  No middle ear effusion.  No foreign body. Tympanic membrane is not erythematous or bulging.     Nose: No rhinorrhea.     Right Sinus: Frontal sinus tenderness present. No maxillary sinus tenderness.     Left Sinus: Frontal sinus tenderness present. No maxillary sinus tenderness.     Mouth/Throat:     Pharynx: Uvula midline. No oropharyngeal exudate or posterior oropharyngeal erythema.     Tonsils: No tonsillar abscesses.  Eyes:     Conjunctiva/sclera: Conjunctivae normal.  Cardiovascular:     Rate and Rhythm: Regular rhythm.     Pulses: Normal pulses.     Heart sounds: Normal heart sounds.  Pulmonary:     Effort: Pulmonary effort is normal.     Breath sounds: Normal breath sounds. No wheezing, rhonchi or rales.  Lymphadenopathy:     Head:     Right side of head: No submental, submandibular,  tonsillar, preauricular, posterior auricular or occipital adenopathy.     Left side of head: No submental, submandibular, tonsillar, preauricular, posterior auricular or occipital adenopathy.     Cervical: No cervical adenopathy.  Skin:    General: Skin is warm and dry.  Neurological:     Mental Status: She is alert.  Psychiatric:        Speech: Speech normal.        Behavior: Behavior normal.        Thought Content: Thought content normal.        Assessment & Plan:   Problem List Items Addressed This Visit      Cardiovascular and Mediastinum   HTN (hypertension)    Marginally elevated today, advised patient to monitor blood pressure at home.  Advised to make a follow-up for HTN        Respiratory   Sinus congestion - Primary    Based on patient's discomfort, duration of symptoms, jointly agreed to start antibiotic therapy is reasonable at this time.  Advised plenty water, Flonase.  Patient will let me know if not better.      Relevant Medications   doxycycline (VIBRA-TABS) 100 MG tablet        I am having Susan Stanley start on doxycycline. I am also having her maintain her Cholecalciferol, Calcium Carbonate-Vitamin D, Vitamin D3, Fish Oil, and amLODipine.   Meds ordered this encounter  Medications  . doxycycline (VIBRA-TABS) 100 MG tablet    Sig: Take 1 tablet (100 mg total) by mouth 2 (two) times daily.    Dispense:  10 tablet    Refill:  0    Order Specific Question:   Supervising Provider    Answer:   Susan Stanley [[id number]]    Return precautions given.   Risks, benefits, and alternatives of the medications and treatment plan prescribed today were discussed, and patient expressed understanding.   Education regarding symptom management and diagnosis given to patient on AVS.  Continue to follow with Susan Hawthorne, FNP for routine health maintenance.   Susan Stanley and I agreed with plan.   Susan Paris, FNP

## 2018-06-23 NOTE — Patient Instructions (Signed)
Start doxycycline.  Plenty of water.  Flonase. Let me know if you are not better

## 2018-06-23 NOTE — Assessment & Plan Note (Signed)
Marginally elevated today, advised patient to monitor blood pressure at home.  Advised to make a follow-up for HTN

## 2018-06-23 NOTE — Assessment & Plan Note (Signed)
Based on patient's discomfort, duration of symptoms, jointly agreed to start antibiotic therapy is reasonable at this time.  Advised plenty water, Flonase.  Patient will let me know if not better.

## 2018-07-11 DIAGNOSIS — Z08 Encounter for follow-up examination after completed treatment for malignant neoplasm: Secondary | ICD-10-CM | POA: Diagnosis not present

## 2018-07-11 DIAGNOSIS — C44722 Squamous cell carcinoma of skin of right lower limb, including hip: Secondary | ICD-10-CM | POA: Diagnosis not present

## 2018-07-11 DIAGNOSIS — D485 Neoplasm of uncertain behavior of skin: Secondary | ICD-10-CM | POA: Diagnosis not present

## 2018-07-11 DIAGNOSIS — D224 Melanocytic nevi of scalp and neck: Secondary | ICD-10-CM | POA: Diagnosis not present

## 2018-07-11 DIAGNOSIS — L308 Other specified dermatitis: Secondary | ICD-10-CM | POA: Diagnosis not present

## 2018-07-11 DIAGNOSIS — Z85828 Personal history of other malignant neoplasm of skin: Secondary | ICD-10-CM | POA: Diagnosis not present

## 2018-08-03 DIAGNOSIS — C44722 Squamous cell carcinoma of skin of right lower limb, including hip: Secondary | ICD-10-CM | POA: Diagnosis not present

## 2018-08-07 ENCOUNTER — Other Ambulatory Visit: Payer: Self-pay | Admitting: Family

## 2018-08-07 DIAGNOSIS — I10 Essential (primary) hypertension: Secondary | ICD-10-CM

## 2018-09-04 ENCOUNTER — Telehealth: Payer: Self-pay | Admitting: Obstetrics and Gynecology

## 2018-09-04 NOTE — Telephone Encounter (Signed)
Patient is calling needing to speak with Dr. Georgianne Fick about something personnel. Didn't want to leave detailed message. Please call

## 2018-10-09 ENCOUNTER — Encounter: Payer: Self-pay | Admitting: Family

## 2018-10-25 ENCOUNTER — Ambulatory Visit (INDEPENDENT_AMBULATORY_CARE_PROVIDER_SITE_OTHER): Payer: Medicare Other | Admitting: Family

## 2018-10-25 ENCOUNTER — Encounter: Payer: Self-pay | Admitting: Family

## 2018-10-25 ENCOUNTER — Other Ambulatory Visit: Payer: Self-pay

## 2018-10-25 DIAGNOSIS — I10 Essential (primary) hypertension: Secondary | ICD-10-CM | POA: Diagnosis not present

## 2018-10-25 DIAGNOSIS — Z1239 Encounter for other screening for malignant neoplasm of breast: Secondary | ICD-10-CM

## 2018-10-25 NOTE — Assessment & Plan Note (Signed)
At goal.  Continue regimen. 

## 2018-10-25 NOTE — Progress Notes (Signed)
This visit type was conducted due to national recommendations for restrictions regarding the COVID-19 pandemic (e.g. social distancing).  This format is felt to be most appropriate for this patient at this time.  All issues noted in this document were discussed and addressed.  No physical exam was performed (except for noted visual exam findings with Video Visits). Virtual Visit via Video Note  I connected with@  on 10/25/18 at  2:00 PM EDT by a video enabled telemedicine application and verified that I am speaking with the correct person using two identifiers.  Location patient: home Location provider:work  Persons participating in the virtual visit: patient, provider  I discussed the limitations of evaluation and management by telemedicine and the availability of in person appointments. The patient expressed understanding and agreed to proceed.   HPI:  Feels well , no complaints.   Feels best she has felt in a long time.   HTN- compliant with medication. At home,  116/70.   Denies exertional chest pain or pressure, numbness or tingling radiating to left arm or jaw, palpitations, dizziness, frequent headaches, changes in vision, or shortness of breath.   Mammogram due  ROS: See pertinent positives and negatives per HPI.  Past Medical History:  Diagnosis Date  . Cancer ([place])    atypical melanoma  . Chicken pox   . Hypertension     Past Surgical History:  Procedure Laterality Date  . APPENDECTOMY    . melanoma removal  2011   top of head    Family History  Problem Relation Age of Onset  . Alcohol abuse Mother   . COPD Mother   . Alcohol abuse Father   . Cancer Father        lung  . Hypertension Father   . Alcohol abuse Sister   . Arthritis Paternal Grandmother   . Cancer Paternal Grandfather        Colon    SOCIAL HX: former   Current Outpatient Medications:  .  amLODipine (NORVASC) 2.5 MG tablet, TAKE 1 TABLET(2.5 MG) BY MOUTH DAILY, Disp: 90 tablet, Rfl:  0 .  Calcium Carbonate-Vitamin D (CALCIUM HIGH POTENCY/VITAMIN D) 600-200 MG-UNIT TABS, Take by mouth., Disp: , Rfl:  .  Cholecalciferol 50 MCG (2000 UT) TABS, Take 1 tablet by mouth daily., Disp: , Rfl:  .  Omega-3 Fatty Acids (FISH OIL) 1000 MG CAPS, Take by mouth., Disp: , Rfl:  .  omeprazole (PRILOSEC) 20 MG capsule, Take 20 mg by mouth every other day., Disp: , Rfl:   EXAM:  VITALS per patient if applicable:  GENERAL: alert, oriented, appears well and in no acute distress  HEENT: atraumatic, conjunttiva clear, no obvious abnormalities on inspection of external nose and ears  NECK: normal movements of the head and neck  LUNGS: on inspection no signs of respiratory distress, breathing rate appears normal, no obvious gross SOB, gasping or wheezing  CV: no obvious cyanosis  MS: moves all visible extremities without noticeable abnormality  PSYCH/NEURO: pleasant and cooperative, no obvious depression or anxiety, speech and thought processing grossly intact  ASSESSMENT AND PLAN:  Discussed the following assessment and plan:  Hypertension, unspecified type  Screening for breast cancer - Plan: MM 3D SCREEN BREAST BILATERAL Problem List Items Addressed This Visit      Cardiovascular and Mediastinum   HTN (hypertension) - Primary    At goal. Continue regimen.         Other   Screening for breast cancer    Overdue. Ordered  and patient understands to schedule.       Relevant Orders   MM 3D SCREEN BREAST BILATERAL         I discussed the assessment and treatment plan with the patient. The patient was provided an opportunity to ask questions and all were answered. The patient agreed with the plan and demonstrated an understanding of the instructions.   The patient was advised to call back or seek an in-person evaluation if the symptoms worsen or if the condition fails to improve as anticipated.   [name], FNP

## 2018-10-25 NOTE — Assessment & Plan Note (Signed)
Overdue. Ordered and patient understands to schedule.

## 2018-10-25 NOTE — Patient Instructions (Addendum)
Mammogram due. Please schedule and let me know if any issues.   Colonoscopy later this year.   So nice to see you!   Stay safe!

## 2018-10-26 ENCOUNTER — Encounter: Payer: Self-pay | Admitting: Family

## 2018-11-04 ENCOUNTER — Other Ambulatory Visit: Payer: Self-pay | Admitting: Family

## 2018-11-04 DIAGNOSIS — I10 Essential (primary) hypertension: Secondary | ICD-10-CM

## 2018-11-06 ENCOUNTER — Other Ambulatory Visit: Payer: Self-pay | Admitting: Family

## 2018-11-06 DIAGNOSIS — Z85820 Personal history of malignant melanoma of skin: Secondary | ICD-10-CM | POA: Diagnosis not present

## 2018-11-06 DIAGNOSIS — L438 Other lichen planus: Secondary | ICD-10-CM | POA: Diagnosis not present

## 2018-11-06 DIAGNOSIS — X32XXXA Exposure to sunlight, initial encounter: Secondary | ICD-10-CM | POA: Diagnosis not present

## 2018-11-06 DIAGNOSIS — Z08 Encounter for follow-up examination after completed treatment for malignant neoplasm: Secondary | ICD-10-CM | POA: Diagnosis not present

## 2018-11-06 DIAGNOSIS — Z85828 Personal history of other malignant neoplasm of skin: Secondary | ICD-10-CM | POA: Diagnosis not present

## 2018-11-06 DIAGNOSIS — L821 Other seborrheic keratosis: Secondary | ICD-10-CM | POA: Diagnosis not present

## 2018-11-06 DIAGNOSIS — L570 Actinic keratosis: Secondary | ICD-10-CM | POA: Diagnosis not present

## 2019-01-02 ENCOUNTER — Telehealth: Payer: Self-pay

## 2019-01-02 NOTE — Telephone Encounter (Signed)
I called to schedule patient f/u to close 4 [facility] care gaps. Patient is due for mammogram plus labs. Mammo has been ordered & she stated that she was going to schedule an appointment for August for this. I have her scheduled for Doxy with you 01/26/19.

## 2019-01-03 NOTE — Telephone Encounter (Signed)
Noted thank you

## 2019-01-05 ENCOUNTER — Other Ambulatory Visit: Payer: Self-pay

## 2019-01-26 ENCOUNTER — Ambulatory Visit (INDEPENDENT_AMBULATORY_CARE_PROVIDER_SITE_OTHER): Payer: Medicare Other | Admitting: Family

## 2019-01-26 ENCOUNTER — Encounter: Payer: Self-pay | Admitting: Family

## 2019-01-26 ENCOUNTER — Other Ambulatory Visit: Payer: Self-pay

## 2019-01-26 VITALS — BP 120/66 | HR 69 | Temp 97.8°F | Wt 136.4 lb

## 2019-01-26 DIAGNOSIS — E559 Vitamin D deficiency, unspecified: Secondary | ICD-10-CM

## 2019-01-26 DIAGNOSIS — I10 Essential (primary) hypertension: Secondary | ICD-10-CM

## 2019-01-26 NOTE — Patient Instructions (Signed)
Colonoscopy done this year as discussed; referral placed  Fasting labs today  Stay safe!

## 2019-01-26 NOTE — Assessment & Plan Note (Signed)
Controlled. Discussed with patient that if she ever wanted to see cardiology for risk stratification, I would place referral . she politely declines today however she will let me know if changes her mind.

## 2019-01-26 NOTE — Progress Notes (Signed)
Subjective:    Patient ID: Susan Stanley, female    DOB: Oct 13, 1950, 68 y.o.   MRN: [id number]  CC: Susan Stanley is a 68 y.o. female who presents today for follow up.   HPI: Feels well today, no complaints.    HTN- 6 compliant medication.  Denies chest pain, shortness of breath or palpitation.    Walks every day for 30 minutes a day, also 'chasing her grandchildren around.'  Denies chest pain with exertion    Mammogram scheduled.  UTD pneumonia.  Due colonoscopy   HISTORY:  Past Medical History:  Diagnosis Date  . Cancer ([place])    atypical melanoma  . Chicken pox   . Hypertension    Past Surgical History:  Procedure Laterality Date  . APPENDECTOMY    . melanoma removal  2011   top of head   Family History  Problem Relation Age of Onset  . Alcohol abuse Mother   . COPD Mother   . Alcohol abuse Father   . Cancer Father        lung  . Hypertension Father   . Alcohol abuse Sister   . Arthritis Paternal Grandmother   . Cancer Paternal Grandfather        Colon    Allergies: Penicillin g Current Outpatient Medications on File Prior to Visit  Medication Sig Dispense Refill  . amLODipine (NORVASC) 2.5 MG tablet TAKE 1 TABLET(2.5 MG) BY MOUTH DAILY 90 tablet 0  . Calcium Carbonate-Vitamin D (CALCIUM HIGH POTENCY/VITAMIN D) 600-200 MG-UNIT TABS Take by mouth.    . Cholecalciferol 50 MCG (2000 UT) TABS Take 1 tablet by mouth daily.    . Omega-3 Fatty Acids (FISH OIL) 1000 MG CAPS Take by mouth.    Marland Kitchen omeprazole (PRILOSEC) 20 MG capsule TAKE 1 CAPSULE(20 MG) BY MOUTH AT BEDTIME 90 capsule 1   No current facility-administered medications on file prior to visit.     Social History   Tobacco Use  . Smoking status: Never Smoker  . Smokeless tobacco: Never Used  Substance Use Topics  . Alcohol use: No  . Drug use: No    Review of Systems  Constitutional: Negative for chills and fever.  Respiratory: Negative for cough.   Cardiovascular: Negative for chest pain and  palpitations.  Gastrointestinal: Negative for nausea and vomiting.      Objective:    BP 120/66   Pulse 69   Temp 97.8 F (36.6 C) (Temporal)   Wt 136 lb 6.4 oz (61.9 kg)   SpO2 95%   BMI 22.02 kg/m  BP Readings from Last 3 Encounters:  01/26/19 120/66  06/23/18 130/78  03/06/18 (!) 180/93   Wt Readings from Last 3 Encounters:  01/26/19 136 lb 6.4 oz (61.9 kg)  06/23/18 131 lb 6.4 oz (59.6 kg)  03/06/18 130 lb 9.6 oz (59.2 kg)    Physical Exam Vitals signs reviewed.  Constitutional:      Appearance: She is well-developed.  Eyes:     Conjunctiva/sclera: Conjunctivae normal.  Cardiovascular:     Rate and Rhythm: Normal rate and regular rhythm.     Pulses: Normal pulses.     Heart sounds: Normal heart sounds.  Pulmonary:     Effort: Pulmonary effort is normal.     Breath sounds: Normal breath sounds. No wheezing, rhonchi or rales.  Skin:    General: Skin is warm and dry.  Neurological:     Mental Status: She is alert.  Psychiatric:  Speech: Speech normal.        Behavior: Behavior normal.        Thought Content: Thought content normal.        Assessment & Plan:   Problem List Items Addressed This Visit      Cardiovascular and Mediastinum   HTN (hypertension) - Primary    Controlled. Discussed with patient that if she ever wanted to see cardiology for risk stratification, I would place referral . she politely declines today however she will let me know if changes her mind.       Relevant Orders   Ambulatory referral to Gastroenterology   TSH   CBC with Differential/Platelet   Comprehensive metabolic panel   Lipid panel     Other   Vitamin D deficiency   Relevant Orders   VITAMIN D 25 Hydroxy (Vit-D Deficiency, Fractures)      Of note, referral for colonoscopy has been placed  I am having Susan Stanley maintain her Calcium Carbonate-Vitamin D, Fish Oil, Cholecalciferol, amLODipine, and omeprazole.   No orders of the defined types were placed  in this encounter.   Return precautions given.   Risks, benefits, and alternatives of the medications and treatment plan prescribed today were discussed, and patient expressed understanding.   Education regarding symptom management and diagnosis given to patient on AVS.  Continue to follow with Susan Hawthorne, FNP for routine health maintenance.   Susan Stanley and I agreed with plan.   Susan Paris, FNP

## 2019-01-27 LAB — COMPREHENSIVE METABOLIC PANEL
AG Ratio: 1.8 (calc) (ref 1.0–2.5)
ALT: 11 U/L (ref 6–29)
AST: 12 U/L (ref 10–35)
Albumin: 4.6 g/dL (ref 3.6–5.1)
Alkaline phosphatase (APISO): 76 U/L (ref 37–153)
BUN: 13 mg/dL (ref 7–25)
CO2: 24 mmol/L (ref 20–32)
Calcium: 9.6 mg/dL (ref 8.6–10.4)
Chloride: 100 mmol/L (ref 98–110)
Creat: 0.75 mg/dL (ref 0.60–0.93)
Globulin: 2.6 g/dL (calc) (ref 1.9–3.7)
Glucose, Bld: 92 mg/dL (ref 65–99)
Potassium: 4 mmol/L (ref 3.5–5.3)
Sodium: 137 mmol/L (ref 135–146)
Total Bilirubin: 0.3 mg/dL (ref 0.2–1.2)
Total Protein: 7.2 g/dL (ref 6.1–8.1)

## 2019-01-27 LAB — CBC WITH DIFFERENTIAL/PLATELET
Absolute Monocytes: 393 cells/uL (ref 200–950)
Basophils Absolute: 20 cells/uL (ref 0–200)
Basophils Relative: 0.4 %
Eosinophils Absolute: 112 cells/uL (ref 15–500)
Eosinophils Relative: 2.2 %
HCT: 39.8 % (ref 35.0–45.0)
Hemoglobin: 13.5 g/dL (ref 11.7–15.5)
Lymphs Abs: 1887 cells/uL (ref 850–3900)
MCH: 30.1 pg (ref 27.0–33.0)
MCHC: 33.9 g/dL (ref 32.0–36.0)
MCV: 88.6 fL (ref 80.0–100.0)
MPV: 10.8 fL (ref 7.5–12.5)
Monocytes Relative: 7.7 %
Neutro Abs: 2688 cells/uL (ref 1500–7800)
Neutrophils Relative %: 52.7 %
Platelets: 354 10*3/uL (ref 140–400)
RBC: 4.49 10*6/uL (ref 3.80–5.10)
RDW: 12.9 % (ref 11.0–15.0)
Total Lymphocyte: 37 %
WBC: 5.1 10*3/uL (ref 3.8–10.8)

## 2019-01-27 LAB — LIPID PANEL
Cholesterol: 225 mg/dL — ABNORMAL HIGH (ref <200)
HDL: 57 mg/dL (ref >=50)
LDL Cholesterol (Calc): 148 mg/dL (calc) — ABNORMAL HIGH
Non-HDL Cholesterol (Calc): 168 mg/dL (calc) — ABNORMAL HIGH (ref <130)
Total CHOL/HDL Ratio: 3.9 (calc) (ref <5.0)
Triglycerides: 94 mg/dL (ref <150)

## 2019-01-27 LAB — VITAMIN D 25 HYDROXY (VIT D DEFICIENCY, FRACTURES): Vit D, 25-Hydroxy: 30 ng/mL (ref 30–100)

## 2019-01-27 LAB — TSH: TSH: 2.7 mIU/L (ref 0.40–4.50)

## 2019-01-29 ENCOUNTER — Encounter: Payer: Self-pay | Admitting: Family

## 2019-02-01 ENCOUNTER — Other Ambulatory Visit: Payer: Self-pay | Admitting: Family

## 2019-02-01 DIAGNOSIS — I10 Essential (primary) hypertension: Secondary | ICD-10-CM

## 2019-02-13 ENCOUNTER — Ambulatory Visit
Admission: RE | Admit: 2019-02-13 | Discharge: 2019-02-13 | Disposition: A | Discharge status: Home / Self Care | Payer: Medicare Other | Source: Ambulatory Visit | Attending: Family | Admitting: Family

## 2019-02-13 DIAGNOSIS — Z1231 Encounter for screening mammogram for malignant neoplasm of breast: Secondary | ICD-10-CM | POA: Insufficient documentation

## 2019-02-13 DIAGNOSIS — Z1239 Encounter for other screening for malignant neoplasm of breast: Secondary | ICD-10-CM

## 2019-03-14 ENCOUNTER — Telehealth: Payer: Self-pay

## 2019-03-14 ENCOUNTER — Other Ambulatory Visit: Payer: Self-pay

## 2019-03-14 DIAGNOSIS — Z1211 Encounter for screening for malignant neoplasm of colon: Secondary | ICD-10-CM

## 2019-03-14 DIAGNOSIS — K219 Gastro-esophageal reflux disease without esophagitis: Secondary | ICD-10-CM

## 2019-03-14 NOTE — Telephone Encounter (Signed)
Gastroenterology Pre-Procedure Review  Request Date: Thursday Requesting Physician: Dr. Marius Ditch  PATIENT REVIEW QUESTIONS: The patient responded to the following health history questions as indicated:    1. Are you having any GI issues? yes (Acid Reflux) 2. Do you have a personal history of Polyps? yes (2015 polyps noted (report is in chart)) 3. Do you have a family history of Colon Cancer or Polyps? no 4. Diabetes Mellitus? no 5. Joint replacements in the past 12 months?no 6. Major health problems in the past 3 months?no 7. Any artificial heart valves, MVP, or defibrillator?no    MEDICATIONS & ALLERGIES:    Patient reports the following regarding taking any anticoagulation/antiplatelet therapy:   Plavix, Coumadin, Eliquis, Xarelto, Lovenox, Pradaxa, Brilinta, or Effient? no Aspirin? no  Patient confirms/reports the following medications:  Current Outpatient Medications  Medication Sig Dispense Refill  . amLODipine (NORVASC) 2.5 MG tablet TAKE 1 TABLET(2.5 MG) BY MOUTH DAILY 90 tablet 0  . Calcium Carbonate-Vitamin D (CALCIUM HIGH POTENCY/VITAMIN D) 600-200 MG-UNIT TABS Take by mouth.    . Cholecalciferol 50 MCG (2000 UT) TABS Take 1 tablet by mouth daily.    . Omega-3 Fatty Acids (FISH OIL) 1000 MG CAPS Take by mouth.    Marland Kitchen omeprazole (PRILOSEC) 20 MG capsule TAKE 1 CAPSULE(20 MG) BY MOUTH AT BEDTIME 90 capsule 1   No current facility-administered medications for this visit.     Patient confirms/reports the following allergies:  Allergies  Allergen Reactions  . Penicillin G     Other reaction(s): Unknown Pt had a reaction when she was a baby but doesn't know the reaction or severity.    No orders of the defined types were placed in this encounter.   AUTHORIZATION INFORMATION Primary Insurance: 1D#: Group #:  Secondary Insurance: 1D#: Group #:  SCHEDULE INFORMATION: Date: 05/10/19 Time: Location:[facility]

## 2019-05-02 ENCOUNTER — Telehealth: Payer: Self-pay | Admitting: Gastroenterology

## 2019-05-02 ENCOUNTER — Telehealth: Payer: Self-pay | Admitting: *Deleted

## 2019-05-02 ENCOUNTER — Encounter: Payer: Self-pay | Admitting: Family Medicine

## 2019-05-02 ENCOUNTER — Ambulatory Visit (INDEPENDENT_AMBULATORY_CARE_PROVIDER_SITE_OTHER): Payer: Medicare Other | Admitting: Family Medicine

## 2019-05-02 VITALS — BP 119/84 | HR 64 | Ht 66.0 in | Wt 132.0 lb

## 2019-05-02 DIAGNOSIS — R1010 Upper abdominal pain, unspecified: Secondary | ICD-10-CM | POA: Diagnosis not present

## 2019-05-02 NOTE — Telephone Encounter (Signed)
Returned patients call.  Notified her that I've canceled her 05/10/19 colonoscopy with Endoscopy.  Asked her to call back when she is feeling better to reschedule.  Thanks Nicosha Struve

## 2019-05-02 NOTE — Telephone Encounter (Signed)
Patient has had abdominal pain across the upper abdominal area cramping like feeling , has had headache. On going since 04/22/19 pain rated at a 7, wakes her from her sleep. Some nausea has had some loose stools in the beginning , last BM yesterday. formed. Patient has had a lot of flatulence, patient has celiac disease but follow gluten intolerance diet.

## 2019-05-02 NOTE — Telephone Encounter (Signed)
Copied from [place] [id number]. Topic: Appointment Scheduling - Scheduling Inquiry for Clinic >> May 02, 2019 12:12 PM Susan Stanley wrote: Reason for CRM:   Pt calling to schedule an appointment for stomach pain going on for 10 days now.  Per [facility] send CRM.

## 2019-05-02 NOTE — Progress Notes (Signed)
Virtual Visit via Video Note  I connected with Shir Beam on 05/02/19 at  4:00 PM EST by a video enabled telemedicine application and verified that I am speaking with the correct person using two identifiers.  Location: Patient: In her home Provider: [place]   I discussed the limitations of evaluation and management by telemedicine and the availability of in person appointments. The patient expressed understanding and agreed to proceed.  History of Present Illness: Chief Complaint  Patient presents with  . Abdominal Pain    Triaged at [place]: Patient has had abdominal pain across the upper abdominal area cramping like feeling , has had headache. On going since 04/22/19 pain rated at a 7, wakes her from her sleep. Some nausea has had some loose stools in the beginning , last BM yesterday. formed. Patient has had a lot of flatulence, patient has celiac disease but follow gluten intolerance diet. Little chills today   This is a 68 yo female who presents today for virtual visit for the above cc. Patient of [name], NP. The patient reports 10 days of upper abdominal pain and headache behind her eyes that started at same time. Felt like she was coming down with something, a little "off," but no fever, fatigue, weakness, myalgias or nausea/vomiting. Abdominal pain is crampy. Symptoms are intermittent in nature, some days are better than others. She has had increased flatulence. She had one episode of diarrhea but otherwise, normal, daily bowel movements with out blood or mucus. She has had little post nasal drainage. She has a history of GERD and has weaned her omeprazole to every other day about 3-4 weeks ago. She does not feel like her symptoms are well controlled on qod dosing. For her abdominal pain/headache, she has taken ibuprofen with some relief, no relief with acetaminophen or hydrocodone (left over from previous dental procedure). Some relief with heat. Eating bland  foods and hydrating without difficulty. No pain currently, had a bad night last night but has felt better today.   Past Medical History:  Diagnosis Date  . Cancer ([place])    atypical melanoma  . Chicken pox   . Hypertension    Past Surgical History:  Procedure Laterality Date  . APPENDECTOMY    . melanoma removal  2011   top of head   Family History  Problem Relation Age of Onset  . Alcohol abuse Mother   . COPD Mother   . Alcohol abuse Father   . Cancer Father        lung  . Hypertension Father   . Alcohol abuse Sister   . Arthritis Paternal Grandmother   . Cancer Paternal Grandfather        Colon  . Breast cancer Neg Hx    Social History   Tobacco Use  . Smoking status: Never Smoker  . Smokeless tobacco: Never Used  Substance Use Topics  . Alcohol use: No  . Drug use: No      Observations/Objective:  BP (!) 177/105 (BP Location: Left Arm, Patient Position: Sitting, Cuff Size: Normal)   Ht 5\' 6"  (1.676 m)   Wt 132 lb (59.9 kg)   BMI 21.31 kg/m  Wt Readings from Last 3 Encounters:  05/02/19 132 lb (59.9 kg)  01/26/19 136 lb 6.4 oz (61.9 kg)  06/23/18 131 lb 6.4 oz (59.6 kg)   BP Readings from Last 3 Encounters:  05/02/19 (!) 177/105  01/26/19 120/66  06/23/18 130/78    Assessment and Plan:  1. Upper abdominal pain - unclear etiology, discussed resuming her daily omeprazole, continuing bland diet, ibuprofen, heat - She will follow up if no improvement in a couple of days - ER precautions reviewed   Clarene Reamer, FNP-BC  [facility] [place]  05/03/2019 3:10 PM   Follow Up Instructions: Sent to patient via mychart   I discussed the assessment and treatment plan with the patient. The patient was provided an opportunity to ask questions and all were answered. The patient agreed with the plan and demonstrated an understanding of the instructions.   The patient was advised to call back or seek an  in-person evaluation if the symptoms worsen or if the condition fails to improve as anticipated.   Elby Beck, FNP

## 2019-05-02 NOTE — Telephone Encounter (Signed)
Scheduled Virtual at [facility] Today at 4.

## 2019-05-02 NOTE — Telephone Encounter (Signed)
Patient called & l/m on v/m to cancel colonoscopy on 05-10-19 not feeling well. She would like a call to confirm we cancelled appointment.

## 2019-05-03 ENCOUNTER — Encounter: Payer: Self-pay | Admitting: Family Medicine

## 2019-05-09 ENCOUNTER — Other Ambulatory Visit: Payer: Self-pay | Admitting: Family

## 2019-05-09 DIAGNOSIS — I10 Essential (primary) hypertension: Secondary | ICD-10-CM

## 2019-05-09 MED ORDER — AMLODIPINE BESYLATE 2.5 MG PO TABS: ORAL_TABLET | ORAL | 0 refills | Status: DC

## 2019-05-09 NOTE — Telephone Encounter (Signed)
Medication Refill - Medication: amLODipine (NORVASC) 2.5 MG tablet  Has the patient contacted their pharmacy? Yes - they haven't heard back from [place] (Agent: If no, request that the patient contact the pharmacy for the refill.) (Agent: If yes, when and what did the pharmacy advise?)  Preferred Pharmacy (with phone number or street name):  [id number] - [place], [place] - [place] [phone] (Phone) [phone] (Fax)   Agent: Please be advised that RX refills may take up to 3 business days. We ask that you follow-up with your pharmacy.

## 2019-05-10 ENCOUNTER — Encounter: Admission: RE | Payer: Self-pay | Source: Home / Self Care

## 2019-05-10 ENCOUNTER — Ambulatory Visit: Admission: RE | Admit: 2019-05-10 | Payer: Medicare Other | Source: Home / Self Care | Admitting: Gastroenterology

## 2019-05-10 SURGERY — COLONOSCOPY WITH PROPOFOL
Anesthesia: General

## 2019-08-03 ENCOUNTER — Other Ambulatory Visit: Payer: Self-pay | Admitting: Family

## 2019-08-06 ENCOUNTER — Other Ambulatory Visit: Payer: Self-pay | Admitting: Family

## 2019-08-06 ENCOUNTER — Telehealth: Payer: Self-pay | Admitting: Family

## 2019-08-06 DIAGNOSIS — I10 Essential (primary) hypertension: Secondary | ICD-10-CM

## 2019-08-06 MED ORDER — AMLODIPINE BESYLATE 2.5 MG PO TABS: ORAL_TABLET | ORAL | 0 refills | Status: DC

## 2019-08-06 NOTE — Telephone Encounter (Signed)
Pt needs [facility] to [facility] on [address]

## 2019-08-06 NOTE — Telephone Encounter (Signed)
Refilled has been fixed.

## 2019-08-06 NOTE — Addendum Note (Signed)
Addended by: Elpidio Galea T on: 08/06/2019 01:44 PM   Modules accepted: Orders

## 2019-08-06 NOTE — Telephone Encounter (Signed)
Pt needs refill on amLODipine (NORVASC) 2.5 MG tablet and amLODipine (NORVASC) 2.5 MG tablet. [facility] [address] [facility].

## 2019-08-09 ENCOUNTER — Telehealth: Payer: Self-pay | Admitting: Family

## 2019-08-09 MED ORDER — OMEPRAZOLE 20 MG PO CPDR: DELAYED_RELEASE_CAPSULE | ORAL | 1 refills | Status: DC

## 2019-08-09 NOTE — Telephone Encounter (Signed)
Pt needs refill on omeprazole (PRILOSEC) 20 MG capsule

## 2019-08-14 ENCOUNTER — Other Ambulatory Visit: Payer: Self-pay

## 2019-08-14 ENCOUNTER — Telehealth: Payer: Self-pay | Admitting: Gastroenterology

## 2019-08-14 DIAGNOSIS — K219 Gastro-esophageal reflux disease without esophagitis: Secondary | ICD-10-CM

## 2019-08-14 DIAGNOSIS — Z1211 Encounter for screening for malignant neoplasm of colon: Secondary | ICD-10-CM

## 2019-08-14 MED ORDER — OMEPRAZOLE 20 MG PO CPDR: DELAYED_RELEASE_CAPSULE | ORAL | 1 refills | Status: DC

## 2019-08-14 NOTE — Telephone Encounter (Signed)
Scheduled patient on 09/03/2019 for procedure. Sent new instructions. Patient states she has prep at home

## 2019-08-14 NOTE — Telephone Encounter (Signed)
Pt said this prescription needs [facility]. I removed [facility] from preferred pharmacy list so this doesn't happen again per patient. Please resend the prescription.

## 2019-08-14 NOTE — Addendum Note (Signed)
Addended by: Elpidio Galea T on: 08/14/2019 09:11 AM   Modules accepted: Orders

## 2019-08-14 NOTE — Telephone Encounter (Signed)
Patient called & would like to reschedule an appointment for an upper & lower with Dr Marius Ditch.

## 2019-08-20 DIAGNOSIS — H6123 Impacted cerumen, bilateral: Secondary | ICD-10-CM | POA: Diagnosis not present

## 2019-08-20 DIAGNOSIS — H60333 Swimmer's ear, bilateral: Secondary | ICD-10-CM | POA: Diagnosis not present

## 2019-08-27 ENCOUNTER — Telehealth: Payer: Self-pay | Admitting: Gastroenterology

## 2019-08-27 NOTE — Telephone Encounter (Signed)
Patient call & needs a cheaper prep called in for her colonoscopy on 09-03-2019 to [facility]. Please call her once this has been called in. If prep is to expensive she will need to cancel procedure.

## 2019-08-27 NOTE — Telephone Encounter (Signed)
None of the [facility] have generic bowel preps in stock.  Patient has been asked if I could call in [place] bowel prep to [facility] in [place].  Patient was okay with this.  Bowel prep has been called to [facility].  I've asked pharmacy to call patient when rx is ready.  Patient has been informed of rx change and pharmacy address.  Thanks,  Susan Stanley, [place]

## 2019-08-29 ENCOUNTER — Telehealth: Payer: Self-pay | Admitting: Gastroenterology

## 2019-08-29 NOTE — Telephone Encounter (Signed)
Patient called & wants to cancel colonoscopy on 09-03-2019 with DR [name]. Nothing against DR Marius Ditch she will be having done by another provider because of the different type prep they use.

## 2019-08-29 NOTE — Telephone Encounter (Signed)
Canceled the procedure per patient request with Almyra Free in ENDO

## 2019-08-30 ENCOUNTER — Encounter: Payer: Self-pay | Admitting: Family

## 2019-08-30 ENCOUNTER — Other Ambulatory Visit: Payer: Medicare Other

## 2019-09-03 ENCOUNTER — Encounter: Admission: RE | Payer: Self-pay | Source: Home / Self Care

## 2019-09-03 ENCOUNTER — Ambulatory Visit: Admission: RE | Admit: 2019-09-03 | Payer: Medicare Other | Source: Home / Self Care | Admitting: Gastroenterology

## 2019-09-03 SURGERY — COLONOSCOPY WITH PROPOFOL
Anesthesia: General

## 2019-09-21 ENCOUNTER — Other Ambulatory Visit: Payer: Self-pay

## 2019-09-21 DIAGNOSIS — K219 Gastro-esophageal reflux disease without esophagitis: Secondary | ICD-10-CM

## 2019-09-21 DIAGNOSIS — Z1211 Encounter for screening for malignant neoplasm of colon: Secondary | ICD-10-CM

## 2019-09-21 MED ORDER — GOLYTELY 236 G PO SOLR: 4000.0000 mL | Freq: Once | ORAL | 0 refills | Status: AC

## 2019-09-21 NOTE — Progress Notes (Signed)
up

## 2019-09-25 ENCOUNTER — Telehealth: Payer: Self-pay | Admitting: Gastroenterology

## 2019-09-25 MED ORDER — NA SULFATE-K SULFATE-MG SULF 17.5-3.13-1.6 GM/177ML PO SOLN: 354.0000 mL | Freq: Once | ORAL | 0 refills | Status: AC

## 2019-09-25 NOTE — Telephone Encounter (Signed)
Patient called & states she would like the suprep called in for her colonoscopy to the [facility] [place].  On 10-08-2019

## 2019-09-25 NOTE — Telephone Encounter (Signed)
Medication sent to the pharmacy.

## 2019-10-04 ENCOUNTER — Other Ambulatory Visit
Admission: RE | Admit: 2019-10-04 | Discharge: 2019-10-04 | Disposition: A | Discharge status: Home / Self Care | Payer: Medicare Other | Source: Ambulatory Visit | Attending: Gastroenterology | Admitting: Gastroenterology

## 2019-10-04 DIAGNOSIS — Z20822 Contact with and (suspected) exposure to covid-19: Secondary | ICD-10-CM | POA: Diagnosis not present

## 2019-10-04 DIAGNOSIS — Z01812 Encounter for preprocedural laboratory examination: Secondary | ICD-10-CM | POA: Insufficient documentation

## 2019-10-04 LAB — SARS CORONAVIRUS 2 (TAT 6-24 HRS): SARS Coronavirus 2: NEGATIVE

## 2019-10-05 ENCOUNTER — Encounter: Payer: Self-pay | Admitting: Gastroenterology

## 2019-10-08 ENCOUNTER — Ambulatory Visit: Payer: Medicare Other | Admitting: Anesthesiology

## 2019-10-08 ENCOUNTER — Encounter
Admission: RE | Disposition: A | Discharge status: Home / Self Care | Payer: Self-pay | Source: Home / Self Care | Attending: Gastroenterology

## 2019-10-08 ENCOUNTER — Ambulatory Visit
Admission: RE | Admit: 2019-10-08 | Discharge: 2019-10-08 | Disposition: A | Discharge status: Home / Self Care | Payer: Medicare Other | Attending: Gastroenterology | Admitting: Gastroenterology

## 2019-10-08 ENCOUNTER — Other Ambulatory Visit: Payer: Self-pay

## 2019-10-08 ENCOUNTER — Encounter: Payer: Self-pay | Admitting: Gastroenterology

## 2019-10-08 DIAGNOSIS — K635 Polyp of colon: Secondary | ICD-10-CM | POA: Diagnosis not present

## 2019-10-08 DIAGNOSIS — K228 Other specified diseases of esophagus: Secondary | ICD-10-CM | POA: Diagnosis not present

## 2019-10-08 DIAGNOSIS — Z1211 Encounter for screening for malignant neoplasm of colon: Secondary | ICD-10-CM

## 2019-10-08 DIAGNOSIS — K219 Gastro-esophageal reflux disease without esophagitis: Secondary | ICD-10-CM

## 2019-10-08 DIAGNOSIS — D120 Benign neoplasm of cecum: Secondary | ICD-10-CM | POA: Diagnosis not present

## 2019-10-08 DIAGNOSIS — Z79899 Other long term (current) drug therapy: Secondary | ICD-10-CM | POA: Insufficient documentation

## 2019-10-08 DIAGNOSIS — D123 Benign neoplasm of transverse colon: Secondary | ICD-10-CM | POA: Insufficient documentation

## 2019-10-08 DIAGNOSIS — K3189 Other diseases of stomach and duodenum: Secondary | ICD-10-CM

## 2019-10-08 DIAGNOSIS — D124 Benign neoplasm of descending colon: Secondary | ICD-10-CM | POA: Insufficient documentation

## 2019-10-08 DIAGNOSIS — I10 Essential (primary) hypertension: Secondary | ICD-10-CM | POA: Diagnosis not present

## 2019-10-08 DIAGNOSIS — K644 Residual hemorrhoidal skin tags: Secondary | ICD-10-CM | POA: Diagnosis not present

## 2019-10-08 DIAGNOSIS — K2270 Barrett's esophagus without dysplasia: Secondary | ICD-10-CM | POA: Insufficient documentation

## 2019-10-08 DIAGNOSIS — K2100 Gastro-esophageal reflux disease with esophagitis, without bleeding: Secondary | ICD-10-CM | POA: Diagnosis not present

## 2019-10-08 DIAGNOSIS — K449 Diaphragmatic hernia without obstruction or gangrene: Secondary | ICD-10-CM | POA: Diagnosis not present

## 2019-10-08 HISTORY — PX: COLONOSCOPY WITH PROPOFOL: SHX5780

## 2019-10-08 HISTORY — DX: Celiac disease: K90.0

## 2019-10-08 HISTORY — PX: ESOPHAGOGASTRODUODENOSCOPY (EGD) WITH PROPOFOL: SHX5813

## 2019-10-08 SURGERY — COLONOSCOPY WITH PROPOFOL
Anesthesia: General

## 2019-10-08 MED ORDER — PROPOFOL 10 MG/ML IV BOLUS
INTRAVENOUS | Status: DC | PRN
  Administered 2019-10-08: 60 mg via INTRAVENOUS
  Administered 2019-10-08: 30 mg via INTRAVENOUS

## 2019-10-08 MED ORDER — PROPOFOL 500 MG/50ML IV EMUL
INTRAVENOUS | Status: DC | PRN
  Administered 2019-10-08: 150 ug/kg/min via INTRAVENOUS

## 2019-10-08 MED ORDER — LIDOCAINE HCL (CARDIAC) PF 100 MG/5ML IV SOSY
PREFILLED_SYRINGE | INTRAVENOUS | Status: DC | PRN
  Administered 2019-10-08: 50 mg via INTRAVENOUS

## 2019-10-08 MED ORDER — SODIUM CHLORIDE 0.9 % IV SOLN: INTRAVENOUS | Status: DC

## 2019-10-08 MED ORDER — PROPOFOL 500 MG/50ML IV EMUL
INTRAVENOUS | Status: AC
  Filled 2019-10-08: qty 50

## 2019-10-08 NOTE — Op Note (Signed)
[facility] Gastroenterology Patient Name: Susan Stanley Procedure Date: 10/08/2019 9:06 AM MRN: [id number] Account #: [id number] Date of Birth: 16-Sep-1950 Admit Type: Outpatient Age: 69 Room: [facility] ENDO ROOM 1 Gender: Female Note Status: Finalized Procedure:             Colonoscopy Indications:           Screening for colorectal malignant neoplasm Providers:             Lin Landsman MD, MD Referring MD:          Yvetta Coder. Arnett (Referring MD) Medicines:             Monitored Anesthesia Care Complications:         No immediate complications. Estimated blood loss:                         Minimal. Procedure:             Pre-Anesthesia Assessment:                        - Prior to the procedure, a History and Physical was                         performed, and patient medications and allergies were                         reviewed. The patient is competent. The risks and                         benefits of the procedure and the sedation options and                         risks were discussed with the patient. All questions                         were answered and informed consent was obtained.                         Patient identification and proposed procedure were                         verified by the physician, the nurse, the                         anesthesiologist, the anesthetist and the technician                         in the pre-procedure area in the procedure room in the                         endoscopy suite. Mental Status Examination: alert and                         oriented. Airway Examination: normal oropharyngeal                         airway and neck mobility. Respiratory Examination:  clear to auscultation. CV Examination: normal.                         Prophylactic Antibiotics: The patient does not require                         prophylactic antibiotics. Prior Anticoagulants: The                         patient  has taken no previous anticoagulant or                         antiplatelet agents. ASA Grade Assessment: II - A                         patient with mild systemic disease. After reviewing                         the risks and benefits, the patient was deemed in                         satisfactory condition to undergo the procedure. The                         anesthesia plan was to use monitored anesthesia care                         (MAC). Immediately prior to administration of                         medications, the patient was re-assessed for adequacy                         to receive sedatives. The heart rate, respiratory                         rate, oxygen saturations, blood pressure, adequacy of                         pulmonary ventilation, and response to care were                         monitored throughout the procedure. The physical                         status of the patient was re-assessed after the                         procedure.                        After obtaining informed consent, the colonoscope was                         passed under direct vision. Throughout the procedure,                         the patient's blood pressure, pulse, and oxygen  saturations were monitored continuously. The                         Colonoscope was introduced through the anus and                         advanced to the the terminal ileum, with                         identification of the appendiceal orifice and IC                         valve. The colonoscopy was performed without                         difficulty. The patient tolerated the procedure well.                         The quality of the bowel preparation was evaluated                         using the BBPS [facility] Bowel Preparation Scale) with                         scores of: Right Colon = 3, Transverse Colon = 3 and                         Left Colon = 3 (entire mucosa seen well with no                          residual staining, small fragments of stool or opaque                         liquid). The total BBPS score equals 9. Findings:      The perianal and digital rectal examinations were normal. Pertinent       negatives include normal sphincter tone and no palpable rectal lesions.      The terminal ileum appeared normal.      Three sessile polyps were found in the transverse colon and cecum. The       polyps were 3 to 5 mm in size. These polyps were removed with a cold       snare. Resection and retrieval were complete.      A diminutive polyp was found in the descending colon. The polyp was       sessile. The polyp was removed with a cold biopsy forceps. Resection and       retrieval were complete.      The retroflexed view of the distal rectum and anal verge was normal and       showed no anal or rectal abnormalities. Impression:            - The examined portion of the ileum was normal.                        - Three 3 to 5 mm polyps in the transverse colon and  in the cecum, removed with a cold snare. Resected and                         retrieved.                        - One diminutive polyp in the descending colon,                         removed with a cold biopsy forceps. Resected and                         retrieved.                        - The distal rectum and anal verge are normal on                         retroflexion view. Recommendation:        - Discharge patient to home (with escort).                        - Resume previous diet today.                        - Continue present medications.                        - Await pathology results.                        - Repeat colonoscopy in 5-7 years for surveillance                         based on pathology results. Procedure Code(s):     --- Professional ---                        [phone], Colonoscopy, flexible; with removal of                         tumor(s), polyp(s), or  other lesion(s) by snare                         technique                        [id number], 93, Colonoscopy, flexible; with biopsy, single                         or multiple Diagnosis Code(s):     --- Professional ---                        Z12.11, Encounter for screening for malignant neoplasm                         of colon                        K63.5, Polyp of colon CPT copyright 2019 American Medical Association. All rights reserved. The codes documented in this report are preliminary and upon  coder review may  be revised to meet current compliance requirements. Dr. Ulyess Mort Lin Landsman MD, MD 10/08/2019 9:45:27 AM This report has been signed electronically. Number of Addenda: 0 Note Initiated On: 10/08/2019 9:06 AM Scope Withdrawal Time: 0 hours 11 minutes 6 seconds  Total Procedure Duration: 0 hours 14 minutes 11 seconds  Estimated Blood Loss:  Estimated blood loss was minimal.      [facility]

## 2019-10-08 NOTE — Anesthesia Postprocedure Evaluation (Signed)
Anesthesia Post Note  Patient: Kaho Schmit  Procedure(s) Performed: COLONOSCOPY WITH PROPOFOL (N/A ) ESOPHAGOGASTRODUODENOSCOPY (EGD) WITH PROPOFOL (N/A )  Patient location during evaluation: Endoscopy Anesthesia Type: General Level of consciousness: awake and alert and oriented Pain management: pain level controlled Vital Signs Assessment: post-procedure vital signs reviewed and stable Respiratory status: spontaneous breathing, nonlabored ventilation and respiratory function stable Cardiovascular status: blood pressure returned to baseline and stable Postop Assessment: no signs of nausea or vomiting Anesthetic complications: no     Last Vitals:  Vitals:   10/08/19 1010 10/08/19 1020  BP: 119/74 132/65  Pulse: 63 63  Resp: 10 12  Temp:    SpO2: 100% 99%    Last Pain:  Vitals:   10/08/19 1020  TempSrc:   PainSc: 0-No pain                 Adison Jerger

## 2019-10-08 NOTE — Anesthesia Procedure Notes (Signed)
Date/Time: 10/08/2019 9:09 AM Performed by: Johnna Acosta, CRNA Pre-anesthesia Checklist: Patient identified, Emergency Drugs available, Suction available, Patient being monitored and Timeout performed Patient Re-evaluated:Patient Re-evaluated prior to induction Oxygen Delivery Method: Nasal cannula Preoxygenation: Pre-oxygenation with 100% oxygen Induction Type: IV induction

## 2019-10-08 NOTE — Anesthesia Preprocedure Evaluation (Signed)
Anesthesia Evaluation  Patient identified by MRN, date of birth, ID band Patient awake    Reviewed: Allergy & Precautions, NPO status , Patient's Chart, lab work & pertinent test results  History of Anesthesia Complications Negative for: history of anesthetic complications  Airway Mallampati: II  TM Distance: >3 FB Neck ROM: Full    Dental no notable dental hx.    Pulmonary neg pulmonary ROS, neg sleep apnea, neg COPD,    breath sounds clear to auscultation- rhonchi (-) wheezing      Cardiovascular Exercise Tolerance: Good hypertension, Pt. on medications (-) CAD, (-) Past MI, (-) Cardiac Stents and (-) CABG  Rhythm:Regular Rate:Normal - Systolic murmurs and - Diastolic murmurs    Neuro/Psych neg Seizures negative neurological ROS  negative psych ROS   GI/Hepatic Neg liver ROS, GERD  ,  Endo/Other  negative endocrine ROSneg diabetes  Renal/GU negative Renal ROS     Musculoskeletal negative musculoskeletal ROS (+)   Abdominal (+) - obese,   Peds  Hematology negative hematology ROS (+)   Anesthesia Other Findings Past Medical History: No date: Cancer ([place])     Comment:  atypical melanoma No date: Celiac disease     Comment:  x20 years No date: Chicken pox No date: Hypertension   Reproductive/Obstetrics                             Anesthesia Physical Anesthesia Plan  ASA: II  Anesthesia Plan: General   Post-op Pain Management:    Induction: Intravenous  PONV Risk Score and Plan: 2 and Propofol infusion  Airway Management Planned: Natural Airway  Additional Equipment:   Intra-op Plan:   Post-operative Plan:   Informed Consent: I have reviewed the patients History and Physical, chart, labs and discussed the procedure including the risks, benefits and alternatives for the proposed anesthesia with the patient or authorized representative who has indicated his/her  understanding and acceptance.     Dental advisory given  Plan Discussed with: CRNA and Anesthesiologist  Anesthesia Plan Comments:         Anesthesia Quick Evaluation

## 2019-10-08 NOTE — Transfer of Care (Signed)
Immediate Anesthesia Transfer of Care Note  Patient: Kinleigh Campoli  Procedure(s) Performed: COLONOSCOPY WITH PROPOFOL (N/A ) ESOPHAGOGASTRODUODENOSCOPY (EGD) WITH PROPOFOL (N/A )  Patient Location: PACU  Anesthesia Type:General  Level of Consciousness: awake and alert   Airway & Oxygen Therapy: Patient Spontanous Breathing and Patient connected to nasal cannula oxygen  Post-op Assessment: Report given to RN and Post -op Vital signs reviewed and stable  Post vital signs: Reviewed and stable  Last Vitals:  Vitals Value Taken Time  BP 111/71 10/08/19 0950  Temp 36.4 C 10/08/19 0950  Pulse 74 10/08/19 0950  Resp 12 10/08/19 0950  SpO2 100 % 10/08/19 0950    Last Pain:  Vitals:   10/08/19 0950  TempSrc: Temporal  PainSc: 0-No pain         Complications: No apparent anesthesia complications

## 2019-10-08 NOTE — Op Note (Signed)
[facility] Gastroenterology Patient Name: Susan Stanley Procedure Date: 10/08/2019 9:07 AM MRN: [id number] Account #: [id number] Date of Birth: 1951/05/21 Admit Type: Outpatient Age: 69 Room: [facility] ENDO ROOM 1 Gender: Female Note Status: Finalized Procedure:             Upper GI endoscopy Indications:           Follow-up of gastro-esophageal reflux disease Providers:             Lin Landsman MD, MD Referring MD:          Yvetta Coder. Arnett (Referring MD) Medicines:             Monitored Anesthesia Care Complications:         No immediate complications. Estimated blood loss: None. Procedure:             Pre-Anesthesia Assessment:                        - Prior to the procedure, a History and Physical was                         performed, and patient medications and allergies were                         reviewed. The patient is competent. The risks and                         benefits of the procedure and the sedation options and                         risks were discussed with the patient. All questions                         were answered and informed consent was obtained.                         Patient identification and proposed procedure were                         verified by the physician, the nurse, the                         anesthesiologist, the anesthetist and the technician                         in the pre-procedure area in the procedure room in the                         endoscopy suite. Mental Status Examination: alert and                         oriented. Airway Examination: normal oropharyngeal                         airway and neck mobility. Respiratory Examination:                         clear to auscultation. CV Examination: normal.  Prophylactic Antibiotics: The patient does not require                         prophylactic antibiotics. Prior Anticoagulants: The                         patient has taken no  previous anticoagulant or                         antiplatelet agents. ASA Grade Assessment: II - A                         patient with mild systemic disease. After reviewing                         the risks and benefits, the patient was deemed in                         satisfactory condition to undergo the procedure. The                         anesthesia plan was to use monitored anesthesia care                         (MAC). Immediately prior to administration of                         medications, the patient was re-assessed for adequacy                         to receive sedatives. The heart rate, respiratory                         rate, oxygen saturations, blood pressure, adequacy of                         pulmonary ventilation, and response to care were                         monitored throughout the procedure. The physical                         status of the patient was re-assessed after the                         procedure.                        After obtaining informed consent, the endoscope was                         passed under direct vision. Throughout the procedure,                         the patient's blood pressure, pulse, and oxygen                         saturations were monitored continuously. The Endoscope  was introduced through the mouth, and advanced to the                         second part of duodenum. The upper GI endoscopy was                         accomplished without difficulty. The patient tolerated                         the procedure well. Findings:      A single 12 mm submucosal nodule with a localized distribution was found       in the second portion of the duodenum. Biopsies were taken with a cold       forceps for histology.      A small hiatal hernia was present.      The entire examined stomach was normal.      The cardia and gastric fundus were normal on retroflexion.      Esophagogastric landmarks were  identified: the gastroesophageal junction       was found at 35 cm from the incisors.      Circumferential salmon-colored mucosa was present from 34 to 35 cm. No       other visible abnormalities were present. The maximum longitudinal       extent of these esophageal mucosal changes was 1 cm in length. Biopsies       were taken with a cold forceps for histology. Impression:            - Submucosal nodule found in the duodenum. Biopsied.                        - Small hiatal hernia.                        - Normal stomach.                        - Esophagogastric landmarks identified.                        - Salmon-colored mucosa suspicious for short-segment                         Barrett's esophagus. Biopsied. Recommendation:        - Await pathology results.                        - Follow an antireflux regimen.                        - Continue present medications. Procedure Code(s):     --- Professional ---                        [phone], Esophagogastroduodenoscopy, flexible,                         transoral; with biopsy, single or multiple Diagnosis Code(s):     --- Professional ---                        K31.89, Other diseases of stomach and duodenum  K44.9, Diaphragmatic hernia without obstruction or                         gangrene                        K22.8, Other specified diseases of esophagus                        K21.9, Gastro-esophageal reflux disease without                         esophagitis CPT copyright 2019 American Medical Association. All rights reserved. The codes documented in this report are preliminary and upon coder review may  be revised to meet current compliance requirements. Dr. Ulyess Mort Lin Landsman MD, MD 10/08/2019 9:27:38 AM This report has been signed electronically. Number of Addenda: 0 Note Initiated On: 10/08/2019 9:07 AM Estimated Blood Loss:  Estimated blood loss: none.      [facility]

## 2019-10-08 NOTE — H&P (Signed)
Susan Darby, MD [address]  [place]  [place], [place] [address]  Main: [phone]  Fax: [phone] Pager: [phone]  Primary Care Physician:  Burnard Hawthorne, FNP Primary Gastroenterologist:  Dr. Cephas Stanley  Pre-Procedure History & Physical: HPI:  Susan Stanley is a 69 y.o. female is here for an endoscopy and colonoscopy.   Past Medical History:  Diagnosis Date  . Cancer ([place])    atypical melanoma  . Celiac disease    x20 years  . Chicken pox   . Hypertension     Past Surgical History:  Procedure Laterality Date  . APPENDECTOMY    . melanoma removal  2011   top of head    Prior to Admission medications   Medication Sig Start Date End Date Taking? Authorizing Provider  amLODipine (NORVASC) 2.5 MG tablet One tab daily PO 08/06/19  Yes Burnard Hawthorne, FNP  omeprazole (PRILOSEC) 20 MG capsule TAKE 1 CAPSULE(20 MG) BY MOUTH AT BEDTIME 08/14/19  Yes Arnett, Yvetta Coder, FNP  Calcium Carbonate-Vitamin D (CALCIUM HIGH POTENCY/VITAMIN D) 600-200 MG-UNIT TABS Take by mouth.    [provider]  Cholecalciferol 50 MCG (2000 UT) TABS Take 1 tablet by mouth daily.    [provider]  Omega-3 Fatty Acids (FISH OIL) 1000 MG CAPS Take by mouth.    [provider]    Allergies as of 09/21/2019 - Review Complete 05/02/2019  Allergen Reaction Noted  . Penicillin g  01/20/2017    Family History  Problem Relation Age of Onset  . Alcohol abuse Mother   . COPD Mother   . Alcohol abuse Father   . Cancer Father        lung  . Hypertension Father   . Alcohol abuse Sister   . Arthritis Paternal Grandmother   . Cancer Paternal Grandfather        Colon  . Breast cancer Neg Hx     Social History   Socioeconomic History  . Marital status: Divorced    Spouse name: Not on file  . Number of children: Not on file  . Years of education: Not on file  . Highest education level: Not on file  Occupational History  . Not on file  Tobacco  Use  . Smoking status: Never Smoker  . Smokeless tobacco: Never Used  Substance and Sexual Activity  . Alcohol use: No  . Drug use: No  . Sexual activity: Not Currently    Birth control/protection: Post-menopausal  Other Topics Concern  . Not on file  Social History Narrative   Daughter is Anson Fret      From [employer]            Social Determinants of Health   Financial Resource Strain:   . Difficulty of Paying Living Expenses:   Food Insecurity:   . Worried About Charity fundraiser in the Last Year:   . Arboriculturist in the Last Year:   Transportation Needs:   . Film/video editor (Medical):   Marland Kitchen Lack of Transportation (Non-Medical):   Physical Activity:   . Days of Exercise per Week:   . Minutes of Exercise per Session:   Stress:   . Feeling of Stress :   Social Connections:   . Frequency of Communication with Friends and Family:   . Frequency of Social Gatherings with Friends and Family:   . Attends Religious Services:   . Active Member of Clubs or Organizations:   .  Attends Archivist Meetings:   Marland Kitchen Marital Status:   Intimate Partner Violence:   . Fear of Current or Ex-Partner:   . Emotionally Abused:   Marland Kitchen Physically Abused:   . Sexually Abused:     Review of Systems: See HPI, otherwise negative ROS  Physical Exam: BP 139/83   Pulse 89   Temp 97.9 F (36.6 C) (Temporal)   Resp 18   Ht 5\' 5"  (1.651 m)   Wt 61.7 kg   SpO2 100%   BMI 22.63 kg/m  General:   Alert,  pleasant and cooperative in NAD Head:  Normocephalic and atraumatic. Neck:  Supple; no masses or thyromegaly. Lungs:  Clear throughout to auscultation.    Heart:  Regular rate and rhythm. Abdomen:  Soft, nontender and nondistended. Normal bowel sounds, without guarding, and without rebound.   Neurologic:  Alert and  oriented x4;  grossly normal neurologically.  Impression/Plan: Susan Stanley is here for an endoscopy and colonoscopy to be performed for h//o celiac disease and  h/o tubular adenoma  Risks, benefits, limitations, and alternatives regarding  endoscopy and colonoscopy have been reviewed with the patient.  Questions have been answered.  All parties agreeable.   Sherri Sear, MD  10/08/2019, 8:38 AM

## 2019-10-09 ENCOUNTER — Encounter: Payer: Self-pay | Admitting: *Deleted

## 2019-10-09 LAB — SURGICAL PATHOLOGY

## 2019-10-10 ENCOUNTER — Encounter: Payer: Self-pay | Admitting: Gastroenterology

## 2019-10-11 ENCOUNTER — Telehealth: Payer: Self-pay

## 2019-10-11 NOTE — Telephone Encounter (Signed)
Put patient in the recall list for 3 years repeat EGD

## 2019-10-11 NOTE — Telephone Encounter (Signed)
-----   Message from Lin Landsman, MD sent at 10/10/2019  5:19 PM EDT ----- Informed  patient of the results via [place].  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  She will need surveillance EGD in 3 years.  Please update in her health care maintenanceRohini [name]

## 2019-10-12 ENCOUNTER — Encounter: Payer: Self-pay | Admitting: Family

## 2019-10-12 DIAGNOSIS — K2270 Barrett's esophagus without dysplasia: Secondary | ICD-10-CM | POA: Insufficient documentation

## 2019-10-30 ENCOUNTER — Other Ambulatory Visit: Payer: Self-pay | Admitting: Family

## 2019-10-30 DIAGNOSIS — I10 Essential (primary) hypertension: Secondary | ICD-10-CM

## 2019-10-31 DIAGNOSIS — Z85820 Personal history of malignant melanoma of skin: Secondary | ICD-10-CM | POA: Diagnosis not present

## 2019-10-31 DIAGNOSIS — S20469A Insect bite (nonvenomous) of unspecified back wall of thorax, initial encounter: Secondary | ICD-10-CM | POA: Diagnosis not present

## 2019-10-31 DIAGNOSIS — D2261 Melanocytic nevi of right upper limb, including shoulder: Secondary | ICD-10-CM | POA: Diagnosis not present

## 2019-10-31 DIAGNOSIS — L821 Other seborrheic keratosis: Secondary | ICD-10-CM | POA: Diagnosis not present

## 2019-10-31 DIAGNOSIS — D2271 Melanocytic nevi of right lower limb, including hip: Secondary | ICD-10-CM | POA: Diagnosis not present

## 2019-10-31 DIAGNOSIS — D2262 Melanocytic nevi of left upper limb, including shoulder: Secondary | ICD-10-CM | POA: Diagnosis not present

## 2019-10-31 DIAGNOSIS — D225 Melanocytic nevi of trunk: Secondary | ICD-10-CM | POA: Diagnosis not present

## 2019-11-02 DIAGNOSIS — H43813 Vitreous degeneration, bilateral: Secondary | ICD-10-CM | POA: Diagnosis not present

## 2020-01-05 ENCOUNTER — Other Ambulatory Visit: Payer: Self-pay | Admitting: Family

## 2020-01-11 ENCOUNTER — Other Ambulatory Visit: Payer: Self-pay

## 2020-01-11 ENCOUNTER — Other Ambulatory Visit: Payer: Self-pay | Admitting: Family Medicine

## 2020-01-11 DIAGNOSIS — I10 Essential (primary) hypertension: Secondary | ICD-10-CM

## 2020-01-11 MED ORDER — OMEPRAZOLE 20 MG PO CPDR: 20.0000 mg | DELAYED_RELEASE_CAPSULE | Freq: Two times a day (BID) | ORAL | 0 refills | Status: DC

## 2020-01-11 NOTE — Telephone Encounter (Signed)
Patient states at her EGD she was supposed to be changed to twice a day omeprazole. Sent medication to pharmacy

## 2020-03-03 DIAGNOSIS — H60543 Acute eczematoid otitis externa, bilateral: Secondary | ICD-10-CM | POA: Diagnosis not present

## 2020-03-03 DIAGNOSIS — H6123 Impacted cerumen, bilateral: Secondary | ICD-10-CM | POA: Diagnosis not present

## 2020-03-03 DIAGNOSIS — H903 Sensorineural hearing loss, bilateral: Secondary | ICD-10-CM | POA: Diagnosis not present

## 2020-04-01 ENCOUNTER — Other Ambulatory Visit: Payer: Self-pay | Admitting: Gastroenterology

## 2020-04-12 ENCOUNTER — Other Ambulatory Visit: Payer: Self-pay | Admitting: Family Medicine

## 2020-04-12 DIAGNOSIS — I10 Essential (primary) hypertension: Secondary | ICD-10-CM

## 2020-05-02 DIAGNOSIS — Z20822 Contact with and (suspected) exposure to covid-19: Secondary | ICD-10-CM | POA: Diagnosis not present

## 2020-06-27 ENCOUNTER — Other Ambulatory Visit: Payer: Self-pay | Admitting: Gastroenterology

## 2020-06-29 DIAGNOSIS — R0789 Other chest pain: Secondary | ICD-10-CM | POA: Diagnosis not present

## 2020-06-29 DIAGNOSIS — R059 Cough, unspecified: Secondary | ICD-10-CM | POA: Diagnosis not present

## 2020-06-29 DIAGNOSIS — U071 COVID-19: Secondary | ICD-10-CM | POA: Diagnosis not present

## 2020-07-08 DIAGNOSIS — Z85828 Personal history of other malignant neoplasm of skin: Secondary | ICD-10-CM | POA: Diagnosis not present

## 2020-07-08 DIAGNOSIS — X32XXXA Exposure to sunlight, initial encounter: Secondary | ICD-10-CM | POA: Diagnosis not present

## 2020-07-08 DIAGNOSIS — D2272 Melanocytic nevi of left lower limb, including hip: Secondary | ICD-10-CM | POA: Diagnosis not present

## 2020-07-08 DIAGNOSIS — Z85820 Personal history of malignant melanoma of skin: Secondary | ICD-10-CM | POA: Diagnosis not present

## 2020-07-08 DIAGNOSIS — D2271 Melanocytic nevi of right lower limb, including hip: Secondary | ICD-10-CM | POA: Diagnosis not present

## 2020-07-08 DIAGNOSIS — D485 Neoplasm of uncertain behavior of skin: Secondary | ICD-10-CM | POA: Diagnosis not present

## 2020-07-08 DIAGNOSIS — D2261 Melanocytic nevi of right upper limb, including shoulder: Secondary | ICD-10-CM | POA: Diagnosis not present

## 2020-07-08 DIAGNOSIS — D225 Melanocytic nevi of trunk: Secondary | ICD-10-CM | POA: Diagnosis not present

## 2020-07-08 DIAGNOSIS — L570 Actinic keratosis: Secondary | ICD-10-CM | POA: Diagnosis not present

## 2020-07-09 ENCOUNTER — Other Ambulatory Visit: Payer: Self-pay | Admitting: Family Medicine

## 2020-07-09 DIAGNOSIS — I10 Essential (primary) hypertension: Secondary | ICD-10-CM

## 2020-09-01 DIAGNOSIS — H902 Conductive hearing loss, unspecified: Secondary | ICD-10-CM | POA: Diagnosis not present

## 2020-09-01 DIAGNOSIS — H6123 Impacted cerumen, bilateral: Secondary | ICD-10-CM | POA: Diagnosis not present

## 2020-10-11 ENCOUNTER — Other Ambulatory Visit: Payer: Self-pay | Admitting: Internal Medicine

## 2020-10-11 DIAGNOSIS — I10 Essential (primary) hypertension: Secondary | ICD-10-CM

## 2020-11-25 DIAGNOSIS — H169 Unspecified keratitis: Secondary | ICD-10-CM | POA: Diagnosis not present

## 2020-11-26 DIAGNOSIS — H169 Unspecified keratitis: Secondary | ICD-10-CM | POA: Diagnosis not present

## 2020-12-01 DIAGNOSIS — H169 Unspecified keratitis: Secondary | ICD-10-CM | POA: Diagnosis not present

## 2020-12-22 DIAGNOSIS — H169 Unspecified keratitis: Secondary | ICD-10-CM | POA: Diagnosis not present

## 2021-01-10 ENCOUNTER — Other Ambulatory Visit: Payer: Self-pay | Admitting: Internal Medicine

## 2021-01-10 DIAGNOSIS — I10 Essential (primary) hypertension: Secondary | ICD-10-CM

## 2021-01-19 DIAGNOSIS — X32XXXA Exposure to sunlight, initial encounter: Secondary | ICD-10-CM | POA: Diagnosis not present

## 2021-01-19 DIAGNOSIS — D2261 Melanocytic nevi of right upper limb, including shoulder: Secondary | ICD-10-CM | POA: Diagnosis not present

## 2021-01-19 DIAGNOSIS — D225 Melanocytic nevi of trunk: Secondary | ICD-10-CM | POA: Diagnosis not present

## 2021-01-19 DIAGNOSIS — D2272 Melanocytic nevi of left lower limb, including hip: Secondary | ICD-10-CM | POA: Diagnosis not present

## 2021-01-19 DIAGNOSIS — Z85828 Personal history of other malignant neoplasm of skin: Secondary | ICD-10-CM | POA: Diagnosis not present

## 2021-01-19 DIAGNOSIS — Z85820 Personal history of malignant melanoma of skin: Secondary | ICD-10-CM | POA: Diagnosis not present

## 2021-01-19 DIAGNOSIS — L570 Actinic keratosis: Secondary | ICD-10-CM | POA: Diagnosis not present

## 2021-01-28 DIAGNOSIS — H169 Unspecified keratitis: Secondary | ICD-10-CM | POA: Diagnosis not present

## 2021-02-02 DIAGNOSIS — H169 Unspecified keratitis: Secondary | ICD-10-CM | POA: Diagnosis not present

## 2021-03-02 DIAGNOSIS — H60543 Acute eczematoid otitis externa, bilateral: Secondary | ICD-10-CM | POA: Diagnosis not present

## 2021-03-02 DIAGNOSIS — H6123 Impacted cerumen, bilateral: Secondary | ICD-10-CM | POA: Diagnosis not present

## 2021-03-28 ENCOUNTER — Encounter: Payer: Self-pay | Admitting: Family

## 2021-03-31 ENCOUNTER — Encounter: Payer: Self-pay | Admitting: Family

## 2021-03-31 ENCOUNTER — Ambulatory Visit (INDEPENDENT_AMBULATORY_CARE_PROVIDER_SITE_OTHER): Payer: Medicare Other | Admitting: Family

## 2021-03-31 ENCOUNTER — Other Ambulatory Visit: Payer: Self-pay

## 2021-03-31 VITALS — BP 120/78 | HR 86 | Temp 98.8°F | Ht 65.0 in | Wt 131.2 lb

## 2021-03-31 DIAGNOSIS — Z780 Asymptomatic menopausal state: Secondary | ICD-10-CM

## 2021-03-31 DIAGNOSIS — I10 Essential (primary) hypertension: Secondary | ICD-10-CM

## 2021-03-31 DIAGNOSIS — R140 Abdominal distension (gaseous): Secondary | ICD-10-CM | POA: Diagnosis not present

## 2021-03-31 DIAGNOSIS — Z1231 Encounter for screening mammogram for malignant neoplasm of breast: Secondary | ICD-10-CM | POA: Diagnosis not present

## 2021-03-31 DIAGNOSIS — K22719 Barrett's esophagus with dysplasia, unspecified: Secondary | ICD-10-CM | POA: Diagnosis not present

## 2021-03-31 LAB — CBC WITH DIFFERENTIAL/PLATELET
Basophils Absolute: 0 10*3/uL (ref 0.0–0.1)
Basophils Relative: 0.5 % (ref 0.0–3.0)
Eosinophils Absolute: 0.1 10*3/uL (ref 0.0–0.7)
Eosinophils Relative: 3.6 % (ref 0.0–5.0)
HCT: 42.9 % (ref 36.0–46.0)
Hemoglobin: 14.1 g/dL (ref 12.0–15.0)
Lymphocytes Relative: 30.2 % (ref 12.0–46.0)
Lymphs Abs: 1.2 10*3/uL (ref 0.7–4.0)
MCHC: 32.7 g/dL (ref 30.0–36.0)
MCV: 92.1 fl (ref 78.0–100.0)
Monocytes Absolute: 0.3 10*3/uL (ref 0.1–1.0)
Monocytes Relative: 7.2 % (ref 3.0–12.0)
Neutro Abs: 2.4 10*3/uL (ref 1.4–7.7)
Neutrophils Relative %: 58.5 % (ref 43.0–77.0)
Platelets: 299 10*3/uL (ref 150.0–400.0)
RBC: 4.66 Mil/uL (ref 3.87–5.11)
RDW: 13.1 % (ref 11.5–15.5)
WBC: 4.1 10*3/uL (ref 4.0–10.5)

## 2021-03-31 LAB — COMPREHENSIVE METABOLIC PANEL
ALT: 14 U/L (ref 0–35)
AST: 18 U/L (ref 0–37)
Albumin: 4.6 g/dL (ref 3.5–5.2)
Alkaline Phosphatase: 78 U/L (ref 39–117)
BUN: 10 mg/dL (ref 6–23)
CO2: 30 mEq/L (ref 19–32)
Calcium: 9.7 mg/dL (ref 8.4–10.5)
Chloride: 101 mEq/L (ref 96–112)
Creatinine, Ser: 0.6 mg/dL (ref 0.40–1.20)
GFR: 89.46 mL/min (ref >60.00)
Glucose, Bld: 96 mg/dL (ref 70–99)
Potassium: 4.2 mEq/L (ref 3.5–5.1)
Sodium: 138 mEq/L (ref 135–145)
Total Bilirubin: 0.5 mg/dL (ref 0.2–1.2)
Total Protein: 7.3 g/dL (ref 6.0–8.3)

## 2021-03-31 LAB — HEMOGLOBIN A1C: Hgb A1c MFr Bld: 6 % (ref 4.6–6.5)

## 2021-03-31 LAB — LIPID PANEL
Cholesterol: 189 mg/dL (ref 0–200)
HDL: 53.8 mg/dL (ref >39.00)
LDL Cholesterol: 121 mg/dL — ABNORMAL HIGH (ref 0–99)
NonHDL: 135.21
Total CHOL/HDL Ratio: 4
Triglycerides: 73 mg/dL (ref 0.0–149.0)
VLDL: 14.6 mg/dL (ref 0.0–40.0)

## 2021-03-31 LAB — SEDIMENTATION RATE: Sed Rate: 26 mm/hr (ref 0–30)

## 2021-03-31 LAB — B12 AND FOLATE PANEL
Folate: 6.2 ng/mL (ref >5.9)
Vitamin B-12: 207 pg/mL — ABNORMAL LOW (ref 211–911)

## 2021-03-31 LAB — VITAMIN D 25 HYDROXY (VIT D DEFICIENCY, FRACTURES): VITD: 28.95 ng/mL — ABNORMAL LOW (ref 30.00–100.00)

## 2021-03-31 LAB — TSH: TSH: 1.48 u[IU]/mL (ref 0.35–5.50)

## 2021-03-31 LAB — C-REACTIVE PROTEIN: CRP: <1 mg/dL (ref 0.5–20.0)

## 2021-03-31 MED ORDER — OMEPRAZOLE 20 MG PO CPDR: 20.0000 mg | DELAYED_RELEASE_CAPSULE | Freq: Every day | ORAL | 1 refills | Status: DC

## 2021-03-31 NOTE — Progress Notes (Signed)
Subjective:    Patient ID: Susan Stanley, female    DOB: [birth year], 70 y.o.   MRN: [id number]  CC: Susan Stanley is a 70 y.o. female who presents today for follow up.   HPI: She has h/o celiac disease.   Consultation with master herbalist last week for concern for delay in digestion.  She describes this is occurring at night after her last meal in which she feels bloating 2 or 3 hours after dinner. No choking, swallowing.  She is avoiding gluten entirely.  No constipation, dysuria, abdominal pain, fever, unusual weight loss. No low back pain, pain with walking, numbness, saddle anesthesia.    She is requested metabolic panel and inflammatory markers for concerns for leaky gut.   GERD- compliant with prilosec 20mg  with resolution of epigastric burning.  No h/o abdominal surgeries   Colonoscopy 10/2019 with polyps, repeat in 5 years EGD 10/2019 suspicious for barretts esophagus  Declines mammogram screening at this time.   She has no h/o RA. No wrist, hands swelling  Hypertension-compliant with amlodipine 2.5 mg .no chest pain HISTORY:  Past Medical History:  Diagnosis Date   Cancer ([place])    atypical melanoma   Celiac disease    x20 years   Chicken pox    Hypertension    Past Surgical History:  Procedure Laterality Date   APPENDECTOMY     COLONOSCOPY WITH PROPOFOL N/A 10/08/2019   Procedure: COLONOSCOPY WITH PROPOFOL;  Surgeon: Lin Landsman, MD;  Location: [facility] ENDOSCOPY;  Service: Gastroenterology;  Laterality: N/A;   ESOPHAGOGASTRODUODENOSCOPY (EGD) WITH PROPOFOL N/A 10/08/2019   Procedure: ESOPHAGOGASTRODUODENOSCOPY (EGD) WITH PROPOFOL;  Surgeon: Lin Landsman, MD;  Location: [facility] ENDOSCOPY;  Service: Gastroenterology;  Laterality: N/A;   melanoma removal  2011   top of head   Family History  Problem Relation Age of Onset   Alcohol abuse Mother    COPD Mother    Alcohol abuse Father    Cancer Father        lung   Hypertension Father    Alcohol abuse Sister     Arthritis Paternal Grandmother    Cancer Paternal Grandfather        Colon   Breast cancer Neg Hx     Allergies: Penicillin g Current Outpatient Medications on File Prior to Visit  Medication Sig Dispense Refill   amLODipine (NORVASC) 2.5 MG tablet TAKE 1 TABLET BY MOUTH EVERY DAY 90 tablet 0   Calcium 600-5 MG-MCG TABS Take 1 tablet by mouth daily.     Cholecalciferol 50 MCG (2000 UT) TABS Take 1 tablet by mouth daily.     Omega-3 Fatty Acids (FISH OIL) 1000 MG CAPS Take by mouth.     No current facility-administered medications on file prior to visit.    Social History   Tobacco Use   Smoking status: Never   Smokeless tobacco: Never  Vaping Use   Vaping Use: Never used  Substance Use Topics   Alcohol use: No   Drug use: No    Review of Systems  Constitutional:  Negative for chills and fever.  Respiratory:  Negative for cough.   Cardiovascular:  Negative for chest pain and palpitations.  Gastrointestinal:  Positive for abdominal distention. Negative for constipation, diarrhea, nausea and vomiting.  Genitourinary:  Negative for dysuria.  Musculoskeletal:  Negative for back pain.  Neurological:  Negative for numbness.     Objective:    BP 120/78 (BP Location: Left Arm, Patient Position: Sitting, Cuff Size: Normal)  Pulse 86   Temp 98.8 F (37.1 C) (Oral)   Ht 5\' 5"  (1.651 m)   Wt 131 lb 3.2 oz (59.5 kg)   SpO2 98%   BMI 21.83 kg/m  BP Readings from Last 3 Encounters:  03/31/21 120/78  10/08/19 132/65  05/02/19 119/84   Wt Readings from Last 3 Encounters:  03/31/21 131 lb 3.2 oz (59.5 kg)  10/08/19 136 lb (61.7 kg)  05/02/19 132 lb (59.9 kg)    Physical Exam Vitals reviewed.  Constitutional:      Appearance: Normal appearance. She is well-developed.  Eyes:     Conjunctiva/sclera: Conjunctivae normal.  Cardiovascular:     Rate and Rhythm: Normal rate and regular rhythm.     Pulses: Normal pulses.     Heart sounds: Normal heart sounds.  Pulmonary:      Effort: Pulmonary effort is normal.     Breath sounds: Normal breath sounds. No wheezing, rhonchi or rales.  Abdominal:     General: Bowel sounds are normal. There is no distension.     Palpations: Abdomen is soft. Abdomen is not rigid. There is no fluid wave or mass.     Tenderness: There is no abdominal tenderness. There is no guarding or rebound.  Skin:    General: Skin is warm and dry.  Neurological:     Mental Status: She is alert.  Psychiatric:        Speech: Speech normal.        Behavior: Behavior normal.        Thought Content: Thought content normal.       Assessment & Plan:   Problem List Items Addressed This Visit       Cardiovascular and Mediastinum   HTN (hypertension)    Chronic, stable.  Continue amlodipine 2.5 mg      Relevant Orders   Comprehensive metabolic panel     Digestive   Barrett's esophagus    History of Barrett's esophagus emphasized the importance of continued indefinite use of omeprazole 20 mg.  Patient will remain compliant.      Relevant Medications   omeprazole (PRILOSEC) 20 MG capsule     Other   Abdominal distention - Primary    New, uncontrolled. Patient well-appearing today.  No abdominal pain, fever or unusual weight loss.  advised patient of ovarian pathology and my recommendation to order pelvic ultrasound, with Ca125.  Patient declines at this time.  Differential also includes GERD, gastroparesis, celiac disease, IBS . she would like to go  focus on dietary changes and labs as requested by herbal specialist whom she is seeing at this time.  I have ordered labs with strict return precautions if abdominal distention does not improve that she returns for follow-up.  Consider CT abdomen pelvis as well.      Relevant Orders   TSH   CBC with Differential/Platelet   Hemoglobin A1c   Lipid panel   VITAMIN D 25 Hydroxy (Vit-D Deficiency, Fractures)   ANA   C-reactive protein   CYCLIC CITRUL PEPTIDE ANTIBODY, IGG/IGA   Rheumatoid  factor   Sedimentation rate   B12 and Folate Panel   DG Bone Density   Other Visit Diagnoses     Encounter for screening mammogram for malignant neoplasm of breast       Asymptomatic menopausal state       Relevant Orders   DG Bone Density        I have discontinued Rashawnda Hew's Calcium Carbonate-Vitamin D. I  have also changed her omeprazole. Additionally, I am having her maintain her Fish Oil, Cholecalciferol, amLODipine, and Calcium.   Meds ordered this encounter  Medications   omeprazole (PRILOSEC) 20 MG capsule    Sig: Take 1 capsule (20 mg total) by mouth daily.    Dispense:  90 capsule    Refill:  1    Order Specific Question:   Supervising Provider    Answer:   Crecencio Mc [[id number]]    Return precautions given.   Risks, benefits, and alternatives of the medications and treatment plan prescribed today were discussed, and patient expressed understanding.   Education regarding symptom management and diagnosis given to patient on AVS.  Continue to follow with Burnard Hawthorne, FNP for routine health maintenance.   Fredericka Mash and I agreed with plan.   [name], FNP

## 2021-03-31 NOTE — Assessment & Plan Note (Signed)
Chronic, stable.  Continue amlodipine 2.5 mg 

## 2021-03-31 NOTE — Assessment & Plan Note (Signed)
History of Barrett's esophagus emphasized the importance of continued indefinite use of omeprazole 20 mg.  Patient will remain compliant.

## 2021-03-31 NOTE — Assessment & Plan Note (Addendum)
New, uncontrolled. Patient well-appearing today.  No abdominal pain, fever or unusual weight loss.  advised patient of ovarian pathology and my recommendation to order pelvic ultrasound, with Ca125.  Patient declines at this time.  Differential also includes GERD, gastroparesis, celiac disease, IBS . she would like to go  focus on dietary changes and labs as requested by herbal specialist whom she is seeing at this time.  I have ordered labs with strict return precautions if abdominal distention does not improve that she returns for follow-up.  Consider CT abdomen pelvis as well.

## 2021-03-31 NOTE — Patient Instructions (Signed)
In the setting of abdominal distention, I always recommend a further evaluation for any ovarian pathology, malignancy which would include imaging and lab work.  If your symptoms do not improve as discussed today, please call me right away as I recommend we begin evaluation.   Nice to see you , always  Please call  and schedule your  bone density scan as we discussed.   [place]  [place], [place]

## 2021-04-02 LAB — RHEUMATOID FACTOR: Rheumatoid fact SerPl-aCnc: <14 IU/mL (ref <14)

## 2021-04-02 LAB — ANTI-NUCLEAR AB-TITER (ANA TITER)
ANA TITER: 1:40 {titer} — ABNORMAL HIGH
ANA Titer 1: 1:40 {titer} — ABNORMAL HIGH

## 2021-04-02 LAB — ANA: Anti Nuclear Antibody (ANA): POSITIVE — AB

## 2021-04-02 LAB — CYCLIC CITRUL PEPTIDE ANTIBODY, IGG/IGA: Cyclic Citrullin Peptide Ab: 5 units (ref 0–19)

## 2021-04-07 ENCOUNTER — Other Ambulatory Visit: Payer: Self-pay | Admitting: Family

## 2021-04-07 DIAGNOSIS — E538 Deficiency of other specified B group vitamins: Secondary | ICD-10-CM

## 2021-04-08 ENCOUNTER — Encounter: Payer: Self-pay | Admitting: Family

## 2021-04-08 DIAGNOSIS — R768 Other specified abnormal immunological findings in serum: Secondary | ICD-10-CM | POA: Insufficient documentation

## 2021-04-17 ENCOUNTER — Other Ambulatory Visit: Payer: Self-pay | Admitting: Internal Medicine

## 2021-04-17 DIAGNOSIS — I10 Essential (primary) hypertension: Secondary | ICD-10-CM

## 2021-06-16 ENCOUNTER — Telehealth: Payer: Self-pay | Admitting: Family

## 2021-06-16 NOTE — Telephone Encounter (Signed)
Patient called and wanted to make an appointment for a bone density test. Looks like a referral is in chart.

## 2021-06-26 ENCOUNTER — Other Ambulatory Visit: Payer: Self-pay | Admitting: Family

## 2021-06-26 ENCOUNTER — Other Ambulatory Visit: Payer: Self-pay

## 2021-06-26 ENCOUNTER — Other Ambulatory Visit: Payer: Self-pay | Admitting: Internal Medicine

## 2021-06-26 DIAGNOSIS — I10 Essential (primary) hypertension: Secondary | ICD-10-CM

## 2021-06-26 DIAGNOSIS — K22719 Barrett's esophagus with dysplasia, unspecified: Secondary | ICD-10-CM

## 2021-06-26 MED ORDER — OMEPRAZOLE 20 MG PO CPDR: DELAYED_RELEASE_CAPSULE | ORAL | 1 refills | Status: DC

## 2021-07-07 NOTE — Telephone Encounter (Signed)
Pt called stating that she called to make an appt for her bone density but [facility] said there was no order in

## 2021-07-08 ENCOUNTER — Other Ambulatory Visit: Payer: Self-pay

## 2021-07-08 DIAGNOSIS — Z780 Asymptomatic menopausal state: Secondary | ICD-10-CM

## 2021-07-08 DIAGNOSIS — Z13820 Encounter for screening for osteoporosis: Secondary | ICD-10-CM

## 2021-07-08 NOTE — Telephone Encounter (Signed)
LMTCB I stated that I had placed DEXA order but she could call back if any issues.

## 2021-07-22 DIAGNOSIS — L820 Inflamed seborrheic keratosis: Secondary | ICD-10-CM | POA: Diagnosis not present

## 2021-07-22 DIAGNOSIS — D225 Melanocytic nevi of trunk: Secondary | ICD-10-CM | POA: Diagnosis not present

## 2021-07-22 DIAGNOSIS — Z85828 Personal history of other malignant neoplasm of skin: Secondary | ICD-10-CM | POA: Diagnosis not present

## 2021-07-22 DIAGNOSIS — D2261 Melanocytic nevi of right upper limb, including shoulder: Secondary | ICD-10-CM | POA: Diagnosis not present

## 2021-07-22 DIAGNOSIS — D485 Neoplasm of uncertain behavior of skin: Secondary | ICD-10-CM | POA: Diagnosis not present

## 2021-07-22 DIAGNOSIS — Z85820 Personal history of malignant melanoma of skin: Secondary | ICD-10-CM | POA: Diagnosis not present

## 2021-07-22 DIAGNOSIS — C792 Secondary malignant neoplasm of skin: Secondary | ICD-10-CM | POA: Diagnosis not present

## 2021-07-22 DIAGNOSIS — L821 Other seborrheic keratosis: Secondary | ICD-10-CM | POA: Diagnosis not present

## 2021-07-22 DIAGNOSIS — L570 Actinic keratosis: Secondary | ICD-10-CM | POA: Diagnosis not present

## 2021-08-19 ENCOUNTER — Ambulatory Visit
Admission: RE | Admit: 2021-08-19 | Discharge: 2021-08-19 | Disposition: A | Discharge status: Home / Self Care | Payer: Medicare Other | Source: Ambulatory Visit | Attending: Family | Admitting: Family

## 2021-08-19 ENCOUNTER — Encounter: Payer: Self-pay | Admitting: Family

## 2021-08-19 ENCOUNTER — Other Ambulatory Visit: Payer: Self-pay

## 2021-08-19 DIAGNOSIS — Z13820 Encounter for screening for osteoporosis: Secondary | ICD-10-CM | POA: Insufficient documentation

## 2021-08-19 DIAGNOSIS — Z780 Asymptomatic menopausal state: Secondary | ICD-10-CM | POA: Diagnosis not present

## 2021-08-19 DIAGNOSIS — M810 Age-related osteoporosis without current pathological fracture: Secondary | ICD-10-CM | POA: Diagnosis not present

## 2021-08-31 DIAGNOSIS — H60543 Acute eczematoid otitis externa, bilateral: Secondary | ICD-10-CM | POA: Diagnosis not present

## 2021-08-31 DIAGNOSIS — H6123 Impacted cerumen, bilateral: Secondary | ICD-10-CM | POA: Diagnosis not present

## 2021-10-03 ENCOUNTER — Other Ambulatory Visit: Payer: Self-pay | Admitting: Internal Medicine

## 2021-10-03 DIAGNOSIS — I10 Essential (primary) hypertension: Secondary | ICD-10-CM

## 2021-12-02 ENCOUNTER — Telehealth: Payer: Self-pay | Admitting: Family

## 2021-12-02 NOTE — Telephone Encounter (Signed)
Patient returned [facility]'s call.  I read the message from Juliann Pulse to patient and patient will call the number to schedule.

## 2021-12-02 NOTE — Telephone Encounter (Signed)
NOTED

## 2021-12-02 NOTE — Telephone Encounter (Signed)
Copied from [place] [phone]. Topic: Medicare AWV >> Dec 02, 2021 11:34 AM Devoria Glassing wrote: Reason for CRM: Called patient to schedule Annual Wellness Visit.  Please schedule with Nurse Health Advisor Denisa O'Brien-Blaney, LPN at [facility].  Please call [phone] ask for [facility]

## 2021-12-03 ENCOUNTER — Ambulatory Visit: Payer: Medicare Other

## 2021-12-03 ENCOUNTER — Ambulatory Visit (INDEPENDENT_AMBULATORY_CARE_PROVIDER_SITE_OTHER): Payer: Medicare Other

## 2021-12-03 VITALS — Ht 65.0 in | Wt 131.0 lb

## 2021-12-03 DIAGNOSIS — Z0000 Encounter for general adult medical examination without abnormal findings: Secondary | ICD-10-CM | POA: Diagnosis not present

## 2021-12-03 NOTE — Progress Notes (Signed)
Subjective:   Susan Stanley is a 71 y.o. female who presents for Medicare Annual (Subsequent) preventive examination.  Review of Systems    No ROS.  Medicare Wellness Virtual Visit.  Visual/audio telehealth visit, UTA vital signs.   See social history for additional risk factors.   Cardiac Risk Factors include: advanced age (>57mn, >>28women)     Objective:    Today's Vitals   12/03/21 0905  Weight: 131 lb (59.4 kg)  Height: '5\' 5"'$  (1.651 m)   Body mass index is 21.8 kg/m.     12/03/2021    9:14 AM 10/08/2019    8:10 AM 08/15/2017    8:51 AM  Advanced Directives  Does Patient Have a Medical Advance Directive? No No No  Would patient like information on creating a medical advance directive? No - Patient declined No - Patient declined No - Patient declined    Current Medications (verified) Outpatient Encounter Medications as of 12/03/2021  Medication Sig   amLODipine (NORVASC) 2.5 MG tablet TAKE 1 TABLET BY MOUTH EVERY DAY   Calcium 600-5 MG-MCG TABS Take 1 tablet by mouth daily.   Cholecalciferol 50 MCG (2000 UT) TABS Take 1 tablet by mouth daily.   Omega-3 Fatty Acids (FISH OIL) 1000 MG CAPS Take by mouth.   omeprazole (PRILOSEC) 20 MG capsule TAKE 1 CAPSULE(20 MG) BY MOUTH AT BEDTIME   No facility-administered encounter medications on file as of 12/03/2021.    Allergies (verified) Penicillin g   History: Past Medical History:  Diagnosis Date   Cancer ([place]    atypical melanoma   Celiac disease    x20 years   Chicken pox    Hypertension    Past Surgical History:  Procedure Laterality Date   APPENDECTOMY     COLONOSCOPY WITH PROPOFOL N/A 10/08/2019   Procedure: COLONOSCOPY WITH PROPOFOL;  Surgeon: VLin Landsman MD;  Location: [facility] ENDOSCOPY;  Service: Gastroenterology;  Laterality: N/A;   ESOPHAGOGASTRODUODENOSCOPY (EGD) WITH PROPOFOL N/A 10/08/2019   Procedure: ESOPHAGOGASTRODUODENOSCOPY (EGD) WITH PROPOFOL;  Surgeon: VLin Landsman MD;  [facility];  Service: Gastroenterology;  Laterality: N/A;   melanoma removal  2011   top of head   Family History  Problem Relation Age of Onset   Alcohol abuse Mother    COPD Mother    Alcohol abuse Father    Cancer Father        lung   Hypertension Father    Alcohol abuse Sister    Diabetes Maternal Grandfather    Arthritis Paternal Grandmother    Cancer Paternal Grandfather        Colon   Breast cancer Neg Hx    Social History   Socioeconomic History   Marital status: Divorced    Spouse name: Not on file   Number of children: Not on file   Years of education: Not on file   Highest education level: Not on file  Occupational History   Not on file  Tobacco Use   Smoking status: Never   Smokeless tobacco: Never  Vaping Use   Vaping Use: Never used  Substance and Sexual Activity   Alcohol use: No   Drug use: No   Sexual activity: Not Currently    Birth control/protection: Post-menopausal  Other Topics Concern   Not on file  Social History Narrative   Daughter is jAnson Fret     From [place]            Social Determinants of  Health   Financial Resource Strain: Low Risk  (12/03/2021)   Overall Financial Resource Strain (CARDIA)    Difficulty of Paying Living Expenses: Not hard at all  Food Insecurity: No Food Insecurity (12/03/2021)   Hunger Vital Sign    Worried About Running Out of Food in the Last Year: Never true    Ran Out of Food in the Last Year: Never true  Transportation Needs: No Transportation Needs (12/03/2021)   PRAPARE - Hydrologist (Medical): No    Lack of Transportation (Non-Medical): No  Physical Activity: Sufficiently Active (12/03/2021)   Exercise Vital Sign    Days of Exercise per Week: 5 days    Minutes of Exercise per Session: 30 min  Stress: Not on file  Social Connections: Unknown (12/03/2021)   Social Connection and Isolation Panel [NHANES]    Frequency of Communication with Friends and Family: More than  three times a week    Frequency of Social Gatherings with Friends and Family: More than three times a week    Attends Religious Services: Not on Advertising copywriter or Organizations: Not on file    Attends Archivist Meetings: Not on file    Marital Status: Not on file    Tobacco Counseling Counseling given: Not Answered   Clinical Intake:  Pre-visit preparation completed: Yes        Diabetes: No  How often do you need to have someone help you when you read instructions, pamphlets, or other written materials from your doctor or pharmacy?: 1 - Never  Interpreter Needed?: No      Activities of Daily Living    12/03/2021    9:08 AM  In your present state of health, do you have any difficulty performing the following activities:  Hearing? 0  Vision? 0  Difficulty concentrating or making decisions? 0  Walking or climbing stairs? 0  Dressing or bathing? 0  Doing errands, shopping? 0  Preparing Food and eating ? N  Using the Toilet? N  In the past six months, have you accidently leaked urine? N  Do you have problems with loss of bowel control? N  Managing your Medications? N  Managing your Finances? N  Housekeeping or managing your Housekeeping? N    Patient Care Team: Burnard Hawthorne, FNP as PCP - General (Family Medicine)  Indicate any recent Medical Services you may have received from other than [facility] providers in the past year (date may be approximate).     Assessment:   This is a routine wellness examination for Susan Stanley.  Virtual Visit via Telephone Note  I connected with  Susan Stanley on 12/03/21 at  9:00 AM EDT by telephone and verified that I am speaking with the correct person using two identifiers.  Persons participating in the virtual visit: patient/Nurse Health Advisor   I discussed the limitations of performing an evaluation and management service by telehealth.Some vital signs may be absent or patient reported.   Hearing/Vision  screen Hearing Screening - Comments:: Patient is able to hear conversational tones without difficulty. No issues reported. Ear canal flushing every 6 months.  Vision Screening - Comments:: Followed by [facility]  Annual visits  Lasik Eye Surgery  Cataract extraction, bilateral They have regular follow up with the ophthalmologist  Dietary issues and exercise activities discussed: Current Exercise Habits: Home exercise routine, Type of exercise: walking (Swimming), Time (Minutes): 30, Frequency (Times/Week): 5, Weekly Exercise (Minutes/Week): 150,  Intensity: Mild Gluten free diet Good water intake   Goals Addressed             This Visit's Progress    Maintain healthy lifestyle       Healthy diet Stay active       Depression Screen    12/03/2021    9:18 AM 03/31/2021   10:04 AM 01/26/2019    4:30 PM 08/15/2017    8:58 AM  PHQ 2/9 Scores  PHQ - 2 Score 0 0 0 0  PHQ- 9 Score   0     Fall Risk    12/03/2021    9:18 AM 03/31/2021   10:04 AM 01/05/2019    1:43 PM 08/15/2017    8:58 AM  Fall Risk   Falls in the past year? 0 0 0 No  Comment   Emmi Telephone Survey: data to providers prior to load   Number falls in past yr: 0     Follow up Falls evaluation completed Falls evaluation completed     [place]: Home free of loose throw rugs in walkways, pet beds, electrical cords, etc? Yes  Adequate lighting in your home to reduce risk of falls? Yes   ASSISTIVE DEVICES UTILIZED TO PREVENT FALLS: Life alert? No  Use of a cane, walker or w/c? No   TIMED UP AND GO: Was the test performed? No .   Cognitive Function: Patient is alert and oriented x3.      08/15/2017    9:00 AM  MMSE - Mini Mental State Exam  Orientation to time 5  Orientation to Place 5  Registration 3  Attention/ Calculation 5  Recall 3  Language- name 2 objects 2  Language- repeat 1  Language- follow 3 step command 3  Language- read & follow direction 1   Write a sentence 1  Copy design 1  Total score 30       Immunizations Immunization History  Administered Date(s) Administered   Influenza-Unspecified 01/26/2019   Pneumococcal Conjugate-13 05/07/2016   Pneumococcal Polysaccharide-23 05/10/2017   Tdap 03/08/2016   Zoster Recombinat (Shingrix) 11/05/2016, 06/30/2017   Screening Tests Health Maintenance  Topic Date Due   INFLUENZA VACCINE  01/05/2022   COLONOSCOPY (Pts 45-12yr Insurance coverage will need to be confirmed)  10/07/2024   TETANUS/TDAP  03/08/2026   Pneumonia Vaccine 71 Years old  Completed   DEXA SCAN  Completed   Hepatitis C Screening  Completed   Zoster Vaccines- Shingrix  Completed   HPV VACCINES  Aged Out   MAMMOGRAM  Discontinued   COVID-19 Vaccine  Discontinued   Health Maintenance There are no preventive care reminders to display for this patient.  Lung Cancer Screening: (Low Dose CT Chest recommended if Age 73-80years, 30 pack-year currently smoking OR have quit w/in 15years.) does not qualify.   Vision Screening: Recommended annual ophthalmology exams for early detection of glaucoma and other disorders of the eye.  Dental Screening: Recommended annual dental exams for proper oral hygiene  Community Resource Referral / Chronic Care Management: CRR required this visit?  No   CCM required this visit?  No      Plan:   Keep all routine maintenance appointments.   I have personally reviewed and noted the following in the patient's chart:   Medical and social history Use of alcohol, tobacco or illicit drugs  Current medications and supplements including opioid prescriptions.  Functional ability and status Nutritional status Physical activity  Advanced directives List of other physicians Hospitalizations, surgeries, and ER visits in previous 12 months Vitals Screenings to include cognitive, depression, and falls Referrals and appointments  In addition, I have reviewed and discussed with  patient certain preventive protocols, quality metrics, and best practice recommendations. A written personalized care plan for preventive services as well as general preventive health recommendations were provided to patient.     Varney Biles, LPN   [id number]

## 2021-12-03 NOTE — Patient Instructions (Addendum)
  Susan Stanley , Thank you for taking time to come for your Medicare Wellness Visit. I appreciate your ongoing commitment to your health goals. Please review the following plan we discussed and let me know if I can assist you in the future.   These are the goals we discussed:  Goals      Maintain healthy lifestyle     Healthy diet Stay active        This is a list of the screening recommended for you and due dates:  Health Maintenance  Topic Date Due   Flu Shot  01/05/2022   Colon Cancer Screening  10/07/2024   Tetanus Vaccine  03/08/2026   Pneumonia Vaccine  Completed   DEXA scan (bone density measurement)  Completed   Hepatitis C Screening: USPSTF Recommendation to screen - Ages 52-79 yo.  Completed   Zoster (Shingles) Vaccine  Completed   HPV Vaccine  Aged Out   Mammogram  Discontinued   COVID-19 Vaccine  Discontinued

## 2021-12-25 ENCOUNTER — Other Ambulatory Visit: Payer: Self-pay | Admitting: Family

## 2021-12-25 DIAGNOSIS — K22719 Barrett's esophagus with dysplasia, unspecified: Secondary | ICD-10-CM

## 2022-01-09 ENCOUNTER — Other Ambulatory Visit: Payer: Self-pay | Admitting: Family

## 2022-01-09 DIAGNOSIS — I10 Essential (primary) hypertension: Secondary | ICD-10-CM

## 2022-01-13 DIAGNOSIS — Z85828 Personal history of other malignant neoplasm of skin: Secondary | ICD-10-CM | POA: Diagnosis not present

## 2022-01-13 DIAGNOSIS — D2272 Melanocytic nevi of left lower limb, including hip: Secondary | ICD-10-CM | POA: Diagnosis not present

## 2022-01-13 DIAGNOSIS — Z85820 Personal history of malignant melanoma of skin: Secondary | ICD-10-CM | POA: Diagnosis not present

## 2022-01-13 DIAGNOSIS — D2262 Melanocytic nevi of left upper limb, including shoulder: Secondary | ICD-10-CM | POA: Diagnosis not present

## 2022-01-13 DIAGNOSIS — D2261 Melanocytic nevi of right upper limb, including shoulder: Secondary | ICD-10-CM | POA: Diagnosis not present

## 2022-01-13 DIAGNOSIS — D225 Melanocytic nevi of trunk: Secondary | ICD-10-CM | POA: Diagnosis not present

## 2022-01-19 ENCOUNTER — Other Ambulatory Visit: Payer: Self-pay | Admitting: Family

## 2022-01-19 DIAGNOSIS — K22719 Barrett's esophagus with dysplasia, unspecified: Secondary | ICD-10-CM

## 2022-02-08 ENCOUNTER — Ambulatory Visit
Admission: EM | Admit: 2022-02-08 | Discharge: 2022-02-08 | Disposition: A | Discharge status: Home / Self Care | Payer: Medicare Other

## 2022-02-08 DIAGNOSIS — R051 Acute cough: Secondary | ICD-10-CM

## 2022-02-08 DIAGNOSIS — J069 Acute upper respiratory infection, unspecified: Secondary | ICD-10-CM

## 2022-02-08 NOTE — ED Provider Notes (Signed)
[facility]    CSN: [id number] Arrival date & time: 02/08/22  1434      History   Chief Complaint Chief Complaint  Patient presents with   Cough    HPI Susan Stanley is a 71 y.o. female.  Patient presents with nonproductive cough x5 days.  She is improving.  She is going out of town tomorrow and would like her lungs checked.  She denies fever, chills, ear pain, sore throat, chest pain, shortness of breath, or other symptoms.  Treatment at home with Tessalon DM which has helped.  Negative COVID test at home.  Her medical history includes hypertension.  The history is provided by the patient and medical records.    Past Medical History:  Diagnosis Date   Cancer ([place])    atypical melanoma   Celiac disease    x20 years   Chicken pox    Hypertension     Patient Active Problem List   Diagnosis Date Noted   ANA positive 04/08/2021   Abdominal distention 03/31/2021   Barrett's esophagus 10/12/2019   Sinus congestion 06/23/2018   NAFLD (nonalcoholic fatty liver disease) 01/10/2018   Vitamin D deficiency 10/12/2017   Bilateral impacted cerumen 10/12/2017   Celiac disease 01/12/2017   Osteoporosis 01/12/2017   HTN (hypertension) 01/12/2017   History of melanoma 01/12/2017   GERD (gastroesophageal reflux disease) 01/12/2017   Encounter for screening colonoscopy 01/12/2017    Past Surgical History:  Procedure Laterality Date   APPENDECTOMY     COLONOSCOPY WITH PROPOFOL N/A 10/08/2019   Procedure: COLONOSCOPY WITH PROPOFOL;  Surgeon: Lin Landsman, MD;  Location: [facility];  Service: Gastroenterology;  Laterality: N/A;   ESOPHAGOGASTRODUODENOSCOPY (EGD) WITH PROPOFOL N/A 10/08/2019   Procedure: ESOPHAGOGASTRODUODENOSCOPY (EGD) WITH PROPOFOL;  Surgeon: Lin Landsman, MD;  Location: [facility] ENDOSCOPY;  Service: Gastroenterology;  Laterality: N/A;   melanoma removal  2011   top of head    OB History     Gravida  3   Para  2   Term  2   Preterm       AB  1   Living  2      SAB      IAB      Ectopic      Multiple      Live Births               Home Medications    Prior to Admission medications   Medication Sig Start Date End Date Taking? Authorizing Provider  amLODipine (NORVASC) 2.5 MG tablet TAKE 1 TABLET BY MOUTH EVERY DAY 01/09/22   Dutch Quint B, FNP  Calcium 600-5 MG-MCG TABS Take 1 tablet by mouth daily.    [provider]  Cholecalciferol 50 MCG (2000 UT) TABS Take 1 tablet by mouth daily.    [provider]  Omega-3 Fatty Acids (FISH OIL) 1000 MG CAPS Take by mouth.    [provider]  omeprazole (PRILOSEC) 20 MG capsule TAKE 1 CAPSULE(20 MG) BY MOUTH AT BEDTIME 01/19/22   Burnard Hawthorne, FNP    Family History Family History  Problem Relation Age of Onset   Alcohol abuse Mother    COPD Mother    Alcohol abuse Father    Cancer Father        lung   Hypertension Father    Alcohol abuse Sister    Diabetes Maternal Grandfather    Arthritis Paternal Grandmother    Cancer Paternal Grandfather  Colon   Breast cancer Neg Hx     Social History Social History   Tobacco Use   Smoking status: Never   Smokeless tobacco: Never  Vaping Use   Vaping Use: Never used  Substance Use Topics   Alcohol use: No   Drug use: No     Allergies   Penicillin g   Review of Systems Review of Systems  Constitutional:  Negative for chills and fever.  HENT:  Negative for ear pain and sore throat.   Respiratory:  Positive for cough. Negative for shortness of breath.   Cardiovascular:  Negative for chest pain and palpitations.  Gastrointestinal:  Negative for diarrhea and vomiting.  Skin:  Negative for color change and rash.  All other systems reviewed and are negative.    Physical Exam Triage Vital Signs ED Triage Vitals  Enc Vitals Group     BP      Pulse      Resp      Temp      Temp src      SpO2      Weight      Height      Head Circumference      Peak Flow       Pain Score      Pain Loc      Pain Edu?      Excl. in [place]?    No data found.  Updated Vital Signs BP (!) 142/82   Pulse (!) 101   Temp 98.2 F (36.8 C)   Resp 18   Ht '5\' 5"'$  (1.651 m)   Wt 135 lb (61.2 kg)   SpO2 96%   BMI 22.47 kg/m   Visual Acuity Right Eye Distance:   Left Eye Distance:   Bilateral Distance:    Right Eye Near:   Left Eye Near:    Bilateral Near:     Physical Exam Vitals and nursing note reviewed.  Constitutional:      General: She is not in acute distress.    Appearance: Normal appearance. She is well-developed. She is not ill-appearing.  HENT:     Right Ear: Tympanic membrane normal.     Left Ear: Tympanic membrane normal.     Nose: Nose normal.     Mouth/Throat:     Mouth: Mucous membranes are moist.     Pharynx: Oropharynx is clear.  Cardiovascular:     Rate and Rhythm: Normal rate and regular rhythm.     Heart sounds: Normal heart sounds.  Pulmonary:     Effort: Pulmonary effort is normal. No respiratory distress.     Breath sounds: Normal breath sounds.  Musculoskeletal:     Cervical back: Neck supple.  Skin:    General: Skin is warm and dry.  Neurological:     Mental Status: She is alert.  Psychiatric:        Mood and Affect: Mood normal.        Behavior: Behavior normal.      UC Treatments / Results  Labs (all labs ordered are listed, but only abnormal results are displayed) Labs Reviewed - No data to display  EKG   Radiology No results found.  Procedures Procedures (including critical care time)  Medications Ordered in UC Medications - No data to display  Initial Impression / Assessment and Plan / UC Course  I have reviewed the triage vital signs and the nursing notes.  Pertinent labs & imaging results that were available  during my care of the patient were reviewed by me and considered in my medical decision making (see chart for details).   Cough, viral URI.  Patient is here to have her lungs checked  because she is going out of town tomorrow.  She has a mild nonproductive cough which is improving.  No other symptoms.  She had a negative COVID test at home and declines PCR test today.  Instructed her to continue OTC cough medication as needed.  Instructed patient to follow up with her PCP if her symptoms are not improving.  She agrees to plan of care.     Final Clinical Impressions(s) / UC Diagnoses   Final diagnoses:  Acute cough  Viral URI     Discharge Instructions      Follow up with your primary care provider if your symptoms are not improving.        ED Prescriptions   None    PDMP not reviewed this encounter.   Sharion Balloon, NP 02/08/22 [phone]

## 2022-02-08 NOTE — Discharge Instructions (Addendum)
Follow up with your primary care provider if your symptoms are not improving.     

## 2022-02-08 NOTE — ED Triage Notes (Signed)
Patient to Urgent Care with complaints of cough x5 days. Negative covid test at home. Has been taking tussin DM.  Denies any other symptoms.

## 2022-02-14 ENCOUNTER — Other Ambulatory Visit: Payer: Self-pay | Admitting: Family

## 2022-02-14 DIAGNOSIS — K22719 Barrett's esophagus with dysplasia, unspecified: Secondary | ICD-10-CM

## 2022-03-01 DIAGNOSIS — H60543 Acute eczematoid otitis externa, bilateral: Secondary | ICD-10-CM | POA: Diagnosis not present

## 2022-03-01 DIAGNOSIS — H6123 Impacted cerumen, bilateral: Secondary | ICD-10-CM | POA: Diagnosis not present

## 2022-04-08 ENCOUNTER — Other Ambulatory Visit: Payer: Self-pay | Admitting: Family

## 2022-04-08 DIAGNOSIS — I10 Essential (primary) hypertension: Secondary | ICD-10-CM

## 2022-04-13 ENCOUNTER — Telehealth: Payer: Self-pay

## 2022-04-13 DIAGNOSIS — I10 Essential (primary) hypertension: Secondary | ICD-10-CM

## 2022-04-13 NOTE — Telephone Encounter (Signed)
Pt had visit with Susan Stanley in June of 2023 but has not seen you since 2022.Would you like me to refill or not

## 2022-04-13 NOTE — Telephone Encounter (Signed)
Pt called stating she would like a refill sent to her pharmacy for amLODipine ([place]) .

## 2022-04-14 MED ORDER — AMLODIPINE BESYLATE 2.5 MG PO TABS: 2.5000 mg | ORAL_TABLET | Freq: Every day | ORAL | 1 refills | Status: DC

## 2022-04-14 NOTE — Addendum Note (Signed)
Addended by: [place], Tyia Binford on: 04/14/2022 01:12 PM   Modules accepted: Orders

## 2022-04-14 NOTE — Telephone Encounter (Signed)
May provide 30-day refill of amlodipine with 1 refill.  Please schedule follow-up

## 2022-04-15 ENCOUNTER — Telehealth: Payer: Self-pay

## 2022-04-15 DIAGNOSIS — K22719 Barrett's esophagus with dysplasia, unspecified: Secondary | ICD-10-CM

## 2022-04-15 MED ORDER — OMEPRAZOLE 20 MG PO CPDR: DELAYED_RELEASE_CAPSULE | ORAL | 0 refills | Status: DC

## 2022-04-15 NOTE — Telephone Encounter (Signed)
Patient called because she is wanting to get a refill on the Omeprazole '20mg'$  . Patient has not been seen in the office since 03/06/18 and informed patient she needed a follow up appointment. She states why does she need this appointment because she is doing fine on the medication. Informed patient for insurance purposes we have to see you in the office to document how you are doing on the medication once a year. Informed patient she could contact her PCP to see if they will refill the medication. She states she will think about which she wants to do and get back with [place].

## 2022-04-15 NOTE — Addendum Note (Signed)
Addended by: Ulyess Blossom L on: 04/15/2022 11:19 AM   Modules accepted: Orders

## 2022-04-15 NOTE — Telephone Encounter (Addendum)
Patient called back and made appointment on 08/30/2022. Patient is asking if she can get the omeprazole '20mg'$ . Please advised if we can refill this till her appointment

## 2022-04-15 NOTE — Telephone Encounter (Signed)
Sent 30 days to the pharmacy

## 2022-04-20 DIAGNOSIS — L821 Other seborrheic keratosis: Secondary | ICD-10-CM | POA: Diagnosis not present

## 2022-04-20 DIAGNOSIS — L570 Actinic keratosis: Secondary | ICD-10-CM | POA: Diagnosis not present

## 2022-04-20 DIAGNOSIS — D485 Neoplasm of uncertain behavior of skin: Secondary | ICD-10-CM | POA: Diagnosis not present

## 2022-05-04 DIAGNOSIS — L570 Actinic keratosis: Secondary | ICD-10-CM | POA: Diagnosis not present

## 2022-05-04 DIAGNOSIS — X32XXXA Exposure to sunlight, initial encounter: Secondary | ICD-10-CM | POA: Diagnosis not present

## 2022-05-11 ENCOUNTER — Other Ambulatory Visit: Payer: Self-pay | Admitting: Gastroenterology

## 2022-05-11 DIAGNOSIS — K22719 Barrett's esophagus with dysplasia, unspecified: Secondary | ICD-10-CM

## 2022-05-12 ENCOUNTER — Other Ambulatory Visit: Payer: Self-pay | Admitting: Family

## 2022-05-12 ENCOUNTER — Telehealth: Payer: Self-pay

## 2022-05-12 DIAGNOSIS — K22719 Barrett's esophagus with dysplasia, unspecified: Secondary | ICD-10-CM

## 2022-05-12 DIAGNOSIS — I10 Essential (primary) hypertension: Secondary | ICD-10-CM

## 2022-05-12 MED ORDER — OMEPRAZOLE 20 MG PO CPDR: DELAYED_RELEASE_CAPSULE | ORAL | 0 refills | Status: DC

## 2022-05-12 NOTE — Telephone Encounter (Signed)
Patient has appointment 05/14/22, but has not been seen since 10/22 advise to refill amlodipine.

## 2022-05-12 NOTE — Telephone Encounter (Signed)
Patient called because she has a follow up appointment in March but needs a refill on omeprazole till then. Sent refills

## 2022-05-14 ENCOUNTER — Encounter: Payer: Self-pay | Admitting: Family

## 2022-05-14 ENCOUNTER — Ambulatory Visit (INDEPENDENT_AMBULATORY_CARE_PROVIDER_SITE_OTHER): Payer: Medicare Other | Admitting: Family

## 2022-05-14 VITALS — BP 128/72 | HR 90 | Temp 98.6°F | Wt 140.8 lb

## 2022-05-14 DIAGNOSIS — I10 Essential (primary) hypertension: Secondary | ICD-10-CM

## 2022-05-14 DIAGNOSIS — R768 Other specified abnormal immunological findings in serum: Secondary | ICD-10-CM | POA: Diagnosis not present

## 2022-05-14 MED ORDER — AMLODIPINE BESYLATE 2.5 MG PO TABS: 2.5000 mg | ORAL_TABLET | Freq: Every day | ORAL | 3 refills | Status: DC

## 2022-05-14 NOTE — Assessment & Plan Note (Signed)
Discussed again being ANA positive.  Patient declines further evaluation as she is not having joint pain or joint swelling.  Declines rheumatology consult.

## 2022-05-14 NOTE — Assessment & Plan Note (Signed)
Chronic, stable.  Continue amlodipine 2.5 mg

## 2022-05-14 NOTE — Progress Notes (Signed)
Subjective:    Patient ID: Susan Stanley, female    DOB: [birth year], 70 y.o.   MRN: [id number]  CC: Susan Stanley is a 71 y.o. female who presents today for follow up.   HPI: Feels well today No complaints.  No specific joint pain. No joint swelling.   She is not screening for breast cancer  Compliant with amlodipine 2.5 mg daily.  No chest pain     HISTORY:  Past Medical History:  Diagnosis Date   Cancer ([place])    atypical melanoma   Celiac disease    x20 years   Chicken pox    Hypertension    Past Surgical History:  Procedure Laterality Date   APPENDECTOMY     COLONOSCOPY WITH PROPOFOL N/A 10/08/2019   Procedure: COLONOSCOPY WITH PROPOFOL;  Surgeon: Lin Landsman, MD;  Location: [facility] ENDOSCOPY;  Service: Gastroenterology;  Laterality: N/A;   ESOPHAGOGASTRODUODENOSCOPY (EGD) WITH PROPOFOL N/A 10/08/2019   Procedure: ESOPHAGOGASTRODUODENOSCOPY (EGD) WITH PROPOFOL;  Surgeon: Lin Landsman, MD;  Location: [facility] ENDOSCOPY;  Service: Gastroenterology;  Laterality: N/A;   melanoma removal  2011   top of head   Family History  Problem Relation Age of Onset   Alcohol abuse Mother    COPD Mother    Alcohol abuse Father    Cancer Father        lung   Hypertension Father    Alcohol abuse Sister    Diabetes Maternal Grandfather    Arthritis Paternal Grandmother    Cancer Paternal Grandfather        Colon   Breast cancer Neg Hx     Allergies: Penicillin g Current Outpatient Medications on File Prior to Visit  Medication Sig Dispense Refill   Calcium 600-5 MG-MCG TABS Take 1 tablet by mouth daily.     Cholecalciferol 50 MCG (2000 UT) TABS Take 1 tablet by mouth daily.     Omega-3 Fatty Acids (FISH OIL) 1000 MG CAPS Take by mouth.     omeprazole (PRILOSEC) 20 MG capsule Take 1 capsule by mouth at bedtime 90 capsule 0   No current facility-administered medications on file prior to visit.    Social History   Tobacco Use   Smoking status: Never   Smokeless tobacco:  Never  Vaping Use   Vaping Use: Never used  Substance Use Topics   Alcohol use: No   Drug use: No    Review of Systems  Constitutional:  Negative for chills and fever.  Respiratory:  Negative for cough.   Cardiovascular:  Negative for chest pain and palpitations.  Gastrointestinal:  Negative for nausea and vomiting.  Musculoskeletal:  Negative for arthralgias and joint swelling.      Objective:    BP 128/72   Pulse 90   Temp 98.6 F (37 C) (Oral)   Wt 140 lb 12.8 oz (63.9 kg)   SpO2 97%   BMI 23.43 kg/m  BP Readings from Last 3 Encounters:  05/14/22 128/72  02/08/22 (!) 142/82  03/31/21 120/78   Wt Readings from Last 3 Encounters:  05/14/22 140 lb 12.8 oz (63.9 kg)  02/08/22 135 lb (61.2 kg)  12/03/21 131 lb (59.4 kg)    Physical Exam Vitals reviewed.  Constitutional:      Appearance: She is well-developed.  Eyes:     Conjunctiva/sclera: Conjunctivae normal.  Cardiovascular:     Rate and Rhythm: Normal rate and regular rhythm.     Pulses: Normal pulses.     Heart sounds:  Normal heart sounds.  Pulmonary:     Effort: Pulmonary effort is normal.     Breath sounds: Normal breath sounds. No wheezing, rhonchi or rales.  Skin:    General: Skin is warm and dry.  Neurological:     Mental Status: She is alert.  Psychiatric:        Speech: Speech normal.        Behavior: Behavior normal.        Thought Content: Thought content normal.        Assessment & Plan:   Problem List Items Addressed This Visit       Cardiovascular and Mediastinum   HTN (hypertension)    Chronic, stable.  Continue amlodipine 2.5 mg      Relevant Medications   amLODipine (NORVASC) 2.5 MG tablet     Other   ANA positive - Primary    Discussed again being ANA positive.  Patient declines further evaluation as she is not having joint pain or joint swelling.  Declines rheumatology consult.        Of note: Patient declines screening labs, screening for breast cancer at this  time. I have changed AmeLie Bautch's amLODipine. I am also having her maintain her Fish Oil, Cholecalciferol, Calcium, and omeprazole.   Meds ordered this encounter  Medications   amLODipine (NORVASC) 2.5 MG tablet    Sig: Take 1 tablet (2.5 mg total) by mouth daily.    Dispense:  90 tablet    Refill:  3    DX Code I10, HTN    Order Specific Question:   Supervising Provider    Answer:   Crecencio Mc [[id number]]    Return precautions given.   Risks, benefits, and alternatives of the medications and treatment plan prescribed today were discussed, and patient expressed understanding.   Education regarding symptom management and diagnosis given to patient on AVS.  Continue to follow with Burnard Hawthorne, FNP for routine health maintenance.   Robinn Boot and I agreed with plan.   [name], FNP

## 2022-06-14 ENCOUNTER — Ambulatory Visit (INDEPENDENT_AMBULATORY_CARE_PROVIDER_SITE_OTHER): Payer: Medicare Other

## 2022-06-14 ENCOUNTER — Ambulatory Visit (INDEPENDENT_AMBULATORY_CARE_PROVIDER_SITE_OTHER): Payer: Medicare Other | Admitting: Family

## 2022-06-14 ENCOUNTER — Encounter: Payer: Self-pay | Admitting: Family

## 2022-06-14 VITALS — BP 126/80 | HR 72 | Temp 98.5°F | Ht 65.0 in | Wt 139.8 lb

## 2022-06-14 DIAGNOSIS — M25511 Pain in right shoulder: Secondary | ICD-10-CM

## 2022-06-14 DIAGNOSIS — R519 Headache, unspecified: Secondary | ICD-10-CM

## 2022-06-14 DIAGNOSIS — M47812 Spondylosis without myelopathy or radiculopathy, cervical region: Secondary | ICD-10-CM | POA: Diagnosis not present

## 2022-06-14 DIAGNOSIS — G629 Polyneuropathy, unspecified: Secondary | ICD-10-CM | POA: Diagnosis not present

## 2022-06-14 DIAGNOSIS — M25519 Pain in unspecified shoulder: Secondary | ICD-10-CM | POA: Insufficient documentation

## 2022-06-14 MED ORDER — GABAPENTIN 100 MG PO CAPS: 100.0000 mg | ORAL_CAPSULE | Freq: Every day | ORAL | 1 refills | Status: DC

## 2022-06-14 NOTE — Patient Instructions (Addendum)
Start trial of gabapentin '100mg'$  at bedtime for neck pain, shoulder pain.  You may increase from 100 mg at bedtime to 200 mg at bedtime after 2-3 days and then to 300 mg at bedtime if needed.    If you would prefer to have a daytime dose, you may then increase from  100 mg at bedtime to,  100 mg taken in the morning, 100 mg tablet taken at lunch and '100mg'$  at bedtime.  There are various options to titrate gabapentin.  As discussed, the most common side effect gabapentin is sleepiness, however that is more likely to be seen in much higher doses  Start magnesium citrate '400mg'$  daily.   Often as a daily preventative, I recommend taking magnesium citrate with riboflavin '400mg'$  daily and CoQ10 '100mg'$  three times daily. You may find these supplements over the counter.    Please let me know how you are doing.  Cervical Strain and Sprain Rehab Ask your health care provider which exercises are safe for you. Do exercises exactly as told by your health care provider and adjust them as directed. It is normal to feel mild stretching, pulling, tightness, or discomfort as you do these exercises. Stop right away if you feel sudden pain or your pain gets worse. Do not begin these exercises until told by your health care provider. Stretching and range-of-motion exercises Cervical side bending  Using good posture, sit on a stable chair or stand up. Without moving your shoulders, slowly tilt your left / right ear to your shoulder until you feel a stretch in the neck muscles on the opposite side. You should be looking straight ahead. Hold for __________ seconds. Repeat with the other side of your neck. Repeat __________ times. Complete this exercise __________ times a day. Cervical rotation  Using good posture, sit on a stable chair or stand up. Slowly turn your head to the side as if you are looking over your left / right shoulder. Keep your eyes level with the ground. Stop when you feel a stretch along the side  and the back of your neck. Hold for __________ seconds. Repeat this by turning to your other side. Repeat __________ times. Complete this exercise __________ times a day. Thoracic extension and pectoral stretch  Roll a towel or a small blanket so it is about 4 inches (10 cm) in diameter. Lie down on your back on a firm surface. Put the towel in the middle of your back across your spine. It should not be under your shoulder blades. Put your hands behind your head and let your elbows fall out to your sides. Hold for __________ seconds. Repeat __________ times. Complete this exercise __________ times a day. Strengthening exercises Upper cervical flexion  Lie on your back with a thin pillow behind your head or a small, rolled-up towel under your neck. Gently tuck your chin toward your chest and nod your head down to look toward your feet. Do not lift your head off the pillow. Hold for __________ seconds. Release the tension slowly. Relax your neck muscles completely before you repeat this exercise. Repeat __________ times. Complete this exercise __________ times a day. Cervical extension  Stand about 6 inches (15 cm) away from a wall, with your back facing the wall. Place a soft object, about 6-8 inches (15-20 cm) in diameter, between the back of your head and the wall. A soft object could be a small pillow, a ball, or a folded towel. Gently tilt your head back and press into  the soft object. Keep your jaw and forehead relaxed. Hold for __________ seconds. Release the tension slowly. Relax your neck muscles completely before you repeat this exercise. Repeat __________ times. Complete this exercise __________ times a day. Posture and body mechanics Body mechanics refer to the movements and positions of your body while you do your daily activities. Posture is part of body mechanics. Good posture and healthy body mechanics can help to relieve stress in your body's tissues and joints. Good  posture means that your spine is in its natural S-curve position (your spine is neutral), your shoulders are pulled back slightly, and your head is not tipped forward. The following are general guidelines for using improved posture and body mechanics in your everyday activities. Sitting  When sitting, keep your spine neutral and keep your feet flat on the floor. Use a footrest, if needed, and keep your thighs parallel to the floor. Avoid rounding your shoulders. Avoid tilting your head forward. When working at a desk or a computer, keep your desk at a height where your hands are slightly lower than your elbows. Slide your chair under your desk so you are close enough to maintain good posture. When working at a computer, place your monitor at a height where you are looking straight ahead and you do not have to tilt your head forward or downward to look at the screen. Standing  When standing, keep your spine neutral and keep your feet about hip-width apart. Keep a slight bend in your knees. Your ears, shoulders, and hips should line up. When you do a task in which you stand in one place for a long time, place one foot up on a stable object that is 2-4 inches (5-10 cm) high, such as a footstool. This helps keep your spine neutral. Resting When lying down and resting, avoid positions that are most painful for you. Try to support your neck in a neutral position. You can use a contour pillow or a small rolled-up towel. Your pillow should support your neck but not push on it. This information is not intended to replace advice given to you by your health care provider. Make sure you discuss any questions you have with your health care provider. Document Revised: 12/14/2021 Document Reviewed: 12/14/2021 Elsevier Patient Education  [place].  Shoulder Impingement Syndrome Rehab Ask your health care provider which exercises are safe for you. Do exercises exactly as told by your health care provider  and adjust them as directed. It is normal to feel mild stretching, pulling, tightness, or discomfort as you do these exercises. Stop right away if you feel sudden pain or your pain gets worse. Do not begin these exercises until told by your health care provider. Stretching and range-of-motion exercise This exercise warms up your muscles and joints and improves the movement and flexibility of your shoulder. This exercise also helps to relieve pain and stiffness. Passive horizontal adduction In passive adduction, you use your other hand to move the injured arm toward your body. The injured arm does not move on its own. In this movement, your arm is moved across your body in the horizontal plane (horizontal adduction). Sit or stand and pull your left / right elbow across your chest, toward your other shoulder. Stop when you feel a gentle stretch in the back of your shoulder and upper arm. Keep your arm at shoulder height. Keep your arm as close to your body as you comfortably can. Hold for __________ seconds. Slowly return to  the starting position. Repeat __________ times. Complete this exercise __________ times a day. Strengthening exercises These exercises build strength and endurance in your shoulder. Endurance is the ability to use your muscles for a long time, even after they get tired. External rotation, isometric This is an exercise in which you press the back of your wrist against a door frame without moving your shoulder joint (isometric). Stand or sit in a doorway, facing the door frame. Bend your left / right elbow and place the back of your wrist against the door frame. Only the back of your wrist should be touching the frame. Keep your upper arm at your side. Gently press your wrist against the door frame, as if you are trying to push your arm away from your abdomen (external rotation). Press as hard as you are able without pain. Avoid shrugging your shoulder while you press your wrist  against the door frame. Keep your shoulder blade tucked down toward the middle of your back. Hold for __________ seconds. Slowly release the tension, and relax your muscles completely before you repeat the exercise. Repeat __________ times. Complete this exercise __________ times a day. Internal rotation, isometric This is an exercise in which you press your palm against a door frame without moving your shoulder joint (isometric). Stand or sit in a doorway, facing the door frame. Bend your left / right elbow and place the palm of your hand against the door frame. Only your palm should be touching the frame. Keep your upper arm at your side. Gently press your hand against the door frame, as if you are trying to push your arm toward your abdomen (internal rotation). Press as hard as you are able without pain. Avoid shrugging your shoulder while you press your hand against the door frame. Keep your shoulder blade tucked down toward the middle of your back. Hold for __________ seconds. Slowly release the tension, and relax your muscles completely before you repeat the exercise. Repeat __________ times. Complete this exercise __________ times a day. Scapular protraction, supine  Lie on your back on a firm surface (supine position). Hold a __________ weight in your left / right hand. Raise your left / right arm straight into the air so your hand is directly above your shoulder joint. Push the weight into the air so your shoulder (scapula) lifts off the surface that you are lying on. The scapula will push up or forward (protraction). Do not move your head, neck, or back. Hold for __________ seconds. Slowly return to the starting position. Let your muscles relax completely before you repeat this exercise. Repeat __________ times. Complete this exercise __________ times a day. Scapular retraction  Sit in a stable chair without armrests, or stand up. Secure an exercise band to a stable object in front  of you so the band is at shoulder height. Hold one end of the exercise band in each hand. Your palms should face down. Squeeze your shoulder blades together (retraction) and move your elbows slightly behind you. Do not shrug your shoulders upward while you do this. Hold for __________ seconds. Slowly return to the starting position. Repeat __________ times. Complete this exercise __________ times a day. Shoulder extension  Sit in a stable chair without armrests, or stand up. Secure an exercise band to a stable object in front of you so the band is above shoulder height. Hold one end of the exercise band in each hand. Straighten your elbows and lift your hands up to shoulder height. Squeeze  your shoulder blades together and pull your hands down to the sides of your thighs (extension). Stop when your hands are straight down by your sides. Do not let your hands go behind your body. Hold for __________ seconds. Slowly return to the starting position. Repeat __________ times. Complete this exercise __________ times a day. This information is not intended to replace advice given to you by your health care provider. Make sure you discuss any questions you have with your health care provider. Document Revised: 09/15/2018 Document Reviewed: 06/19/2018 Elsevier Patient Education  [place].

## 2022-06-14 NOTE — Progress Notes (Unsigned)
Assessment & Plan:  There are no diagnoses linked to this encounter.   Return precautions given.   Risks, benefits, and alternatives of the medications and treatment plan prescribed today were discussed, and patient expressed understanding.   Education regarding symptom management and diagnosis given to patient on AVS either electronically or printed.  No follow-ups on file.  [name], FNP  Subjective:    Patient ID: Susan Stanley, female    DOB: 1950/12/29, 72 y.o.   MRN: [id number]  CC: Susan Stanley is a 72 y.o. female who presents today for an acute visit.    HPI: Right upper arm, shoulder pain x 4 weeks, waxes and waned.  More noticeable at night. A PT friend of hers recommended exercises which has been helpful.  She crochets.  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    She is taking advil with some relief.   She also endorses dull HA behind her eyes. No ear pain, vision loss , cough, fever, sore throat She was exposed to covid, negative at home.    H/o ANA positive   Compliant with gabapentin '1000mg'$    Allergies: Penicillin g Current Outpatient Medications on File Prior to Visit  Medication Sig Dispense Refill   amLODipine (NORVASC) 2.5 MG tablet Take 1 tablet (2.5 mg total) by mouth daily. 90 tablet 3   Calcium 600-5 MG-MCG TABS Take 1 tablet by mouth daily.     Cholecalciferol 50 MCG (2000 UT) TABS Take 1 tablet by mouth daily.     Omega-3 Fatty Acids (FISH OIL) 1000 MG CAPS Take by mouth.     omeprazole (PRILOSEC) 20 MG capsule Take 1 capsule by mouth at bedtime 90 capsule 0   No current facility-administered medications on file prior to visit.    Review of Systems    Objective:    BP 126/80   Pulse 72   Temp 98.5 F (36.9 C)   Ht '5\' 5"'$  (1.651 m)   Wt 139 lb 12.8 oz (63.4 kg)   SpO2 98%   BMI 23.26 kg/m   BP Readings from Last 3  Encounters:  06/14/22 126/80  05/14/22 128/72  02/08/22 (!) 142/82   Wt Readings from Last 3 Encounters:  06/14/22 139 lb 12.8 oz (63.4 kg)  05/14/22 140 lb 12.8 oz (63.9 kg)  02/08/22 135 lb (61.2 kg)    Physical Exam Vitals reviewed.  Constitutional:      Appearance: She is well-developed.  HENT:     Mouth/Throat:     Pharynx: Uvula midline.  Eyes:     Conjunctiva/sclera: Conjunctivae normal.     Pupils: Pupils are equal, round, and reactive to light.     Comments: Fundus normal bilaterally.   Cardiovascular:     Rate and Rhythm: Normal rate and regular rhythm.     Pulses: Normal pulses.     Heart sounds: Normal heart sounds.  Pulmonary:     Effort: Pulmonary effort is normal.     Breath sounds: Normal breath sounds. No wheezing, rhonchi or rales.  Skin:    General: Skin is warm and dry.  Neurological:     Mental Status: She is alert.     Cranial Nerves: No cranial nerve deficit.     Sensory: No sensory deficit.     Deep Tendon Reflexes:     Reflex Scores:      Bicep  reflexes are 2+ on the right side and 2+ on the left side.      Patellar reflexes are 2+ on the right side and 2+ on the left side.    Comments: Grip equal and strong bilateral upper extremities. Gait strong and steady. Able to perform rapid alternating movement without difficulty.   Psychiatric:        Speech: Speech normal.        Behavior: Behavior normal.        Thought Content: Thought content normal.

## 2022-06-15 ENCOUNTER — Other Ambulatory Visit: Payer: Self-pay | Admitting: Family

## 2022-06-15 DIAGNOSIS — R9389 Abnormal findings on diagnostic imaging of other specified body structures: Secondary | ICD-10-CM

## 2022-06-15 DIAGNOSIS — G44019 Episodic cluster headache, not intractable: Secondary | ICD-10-CM | POA: Insufficient documentation

## 2022-06-15 DIAGNOSIS — R519 Headache, unspecified: Secondary | ICD-10-CM | POA: Insufficient documentation

## 2022-06-15 NOTE — Assessment & Plan Note (Signed)
Reassuring neurologic exam. New onset HA and if no resolution with trial of magnesium citrate for 100 mg daily, will advise neuroimaging.

## 2022-06-15 NOTE — Assessment & Plan Note (Signed)
Pending x-ray of the right shoulder, cervical spine.   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  Patient politely declines screening for B12 levels at this time in setting of episodic numbness.  Consider referral to rheumatology due to history of positive ANA; however joint pain specifically today c/w  osteoarthritis versus related to rheumatoid arthritis.  Trial of gabapentin 100 mg and advised how to titrate.  Close follow-up

## 2022-06-16 ENCOUNTER — Ambulatory Visit (INDEPENDENT_AMBULATORY_CARE_PROVIDER_SITE_OTHER): Payer: Medicare Other

## 2022-06-16 ENCOUNTER — Other Ambulatory Visit: Payer: Medicare Other

## 2022-06-16 DIAGNOSIS — R9389 Abnormal findings on diagnostic imaging of other specified body structures: Secondary | ICD-10-CM | POA: Diagnosis not present

## 2022-06-16 DIAGNOSIS — J439 Emphysema, unspecified: Secondary | ICD-10-CM | POA: Diagnosis not present

## 2022-06-16 DIAGNOSIS — R911 Solitary pulmonary nodule: Secondary | ICD-10-CM | POA: Diagnosis not present

## 2022-06-18 ENCOUNTER — Encounter: Payer: Self-pay | Admitting: Family

## 2022-06-18 ENCOUNTER — Other Ambulatory Visit: Payer: Self-pay | Admitting: Family

## 2022-06-18 DIAGNOSIS — R9389 Abnormal findings on diagnostic imaging of other specified body structures: Secondary | ICD-10-CM

## 2022-06-18 DIAGNOSIS — I700 Atherosclerosis of aorta: Secondary | ICD-10-CM | POA: Insufficient documentation

## 2022-06-21 ENCOUNTER — Encounter: Payer: Self-pay | Admitting: Family

## 2022-06-21 NOTE — Telephone Encounter (Signed)
See previous message

## 2022-06-22 ENCOUNTER — Ambulatory Visit
Admission: RE | Admit: 2022-06-22 | Discharge: 2022-06-22 | Disposition: A | Discharge status: Home / Self Care | Payer: Medicare Other | Source: Ambulatory Visit | Attending: Family | Admitting: Family

## 2022-06-22 DIAGNOSIS — R9389 Abnormal findings on diagnostic imaging of other specified body structures: Secondary | ICD-10-CM | POA: Insufficient documentation

## 2022-06-22 DIAGNOSIS — R918 Other nonspecific abnormal finding of lung field: Secondary | ICD-10-CM | POA: Diagnosis not present

## 2022-06-29 ENCOUNTER — Encounter: Payer: Self-pay | Admitting: Family

## 2022-07-07 ENCOUNTER — Telehealth (INDEPENDENT_AMBULATORY_CARE_PROVIDER_SITE_OTHER): Payer: Medicare Other | Admitting: Family

## 2022-07-07 DIAGNOSIS — R911 Solitary pulmonary nodule: Secondary | ICD-10-CM | POA: Diagnosis not present

## 2022-07-07 DIAGNOSIS — I700 Atherosclerosis of aorta: Secondary | ICD-10-CM

## 2022-07-07 NOTE — Assessment & Plan Note (Signed)
Referral to lung nodule clinic.  Will follow

## 2022-07-07 NOTE — Patient Instructions (Addendum)
Excellent calculator that I used clinically to assess risk.    [email or web address]    I have ordered CT calcium score ( SELF PAY option)  to further stratify your overall cardiovascular risk .   An estimate of cost is $150-200 out-of-pocket as not covered by insurance. However please call your insurance company in advance to ensure no other costs so that you do not have any unexpected bills.     In [facility], It can be performed at any of these 3 locations (outpatient imaging center [facility]) at [address]/ [facility], [facility] or [facility] outpatient center)   I have placed your order to [facility] in [place] as  generally most convenient.   Phone Number to [facility] on [address] is is [phone] to get scheduled. You may ask for tech there, Colletta Maryland, she is great as well.   Please call to get scheduled and if any issues at all in doing so, please let me know.   Below an article from [place] regarding the test.   [email or web address]/exams-[email or web address]-[email or web address]-[email or web address]~:[email or web address]%20CT%20calcium%20score,arteries%20can%20cause%20heart%20attacks.   Exams We Offer: Cardiac CT Calcium Score  Knowing your score could save your life. A cardiac CT calcium score, also known as a coronary calcium scan, is a quick, convenient and noninvasive way of evaluating the amount of calcified (hard) plaque in your heart vessels. The level of calcium equates to the extent of plaque build-up in your arteries. Plaque in the arteries can cause heart attacks.  The radiologist reads the images and sends your doctor a report with a calcium score. Patients with higher scores have a greater risk for a heart attack, heart disease or stroke. Knowing your score can help your doctor decide on blood pressure and cholesterol goals that will minimize your risk as much as possible.  The  [employer] of Cardiology found that Coronary artery calcification (CAC) is an excellent cardiovascular disease risk marker and can help guide the decision to use cholesterol reducing medications such as statins. A negative calcium score may reduce the need for statins in otherwise eligible patients.  The exam takes less than 10 minutes, is painless and does not require any IV or oral contrast. At [place] locations, the out-of-pocket fee without insurance is $75. At the time of scheduling, please let us know if you want to process the exam through your insurance or self-pay at the rate of $75. Patients who want to self-pay should not submit their insurance card when checking in for the appointment.   Who should get a Cardiac CT Calcium Score: Middle age adults at intermediate risk of heart disease Family history of heart disease Borderline high cholesterol, high blood pressure or diabetes Overweight or physical inactivity Uncertain about taking daily preventive medical therapy

## 2022-07-07 NOTE — Assessment & Plan Note (Addendum)
Lab Results  Component Value Date   LDLCALC 121 (H) 03/31/2021   Calculated ASCVD with Prevent American Heart Association risk calculator of 17.8%.  Advised statin therapy in the setting of known coronary artery disease based on CT chest.  Further advised Ct calcium score to assess for risk of ASCVD.  She politely declines statin therapy and CT calcium score at this time.  She may consider CT calcium score in the future.

## 2022-07-07 NOTE — Progress Notes (Signed)
Virtual Visit via Video Note  I connected with Susan Stanley on 07/07/22 at  4:00 PM EST by a video enabled telemedicine application and verified that I am speaking with the correct person using two identifiers. Location patient: home Location provider: work  Persons participating in the virtual visit: patient, provider  I discussed the limitations of evaluation and management by telemedicine and the availability of in person appointments. The patient expressed understanding and agreed to proceed.  HPI: Discuss CT chest Feels well today.  Shoulder pain has improved. She was exposed to second hand smoke as child.  Never smoker    ROS: See pertinent positives and negatives per HPI.  EXAM:  VITALS per patient if applicable: There were no vitals taken for this visit. BP Readings from Last 3 Encounters:  06/14/22 126/80  05/14/22 128/72  02/08/22 (!) 142/82   Wt Readings from Last 3 Encounters:  06/14/22 139 lb 12.8 oz (63.4 kg)  05/14/22 140 lb 12.8 oz (63.9 kg)  02/08/22 135 lb (61.2 kg)    GENERAL: alert, oriented, appears well and in no acute distress  HEENT: atraumatic, conjunttiva clear, no obvious abnormalities on inspection of external nose and ears  NECK: normal movements of the head and neck  LUNGS: on inspection no signs of respiratory distress, breathing rate appears normal, no obvious gross SOB, gasping or wheezing  CV: no obvious cyanosis  MS: moves all visible extremities without noticeable abnormality  PSYCH/NEURO: pleasant and cooperative, no obvious depression or anxiety, speech and thought processing grossly intact  ASSESSMENT AND PLAN: Lung nodule Assessment & Plan: Referral to lung nodule clinic.  Will follow  Orders: -     AMB  Referral to Pulmonary Nodule Clinic  Aortic atherosclerosis [facility]) Assessment & Plan: Lab Results  Component Value Date   LDLCALC 121 (H) 03/31/2021   Calculated ASCVD with Prevent American Heart Association risk  calculator of 17.8%.  Advised statin therapy in the setting of known coronary artery disease based on CT chest.  Further advised Ct calcium score to assess for risk of ASCVD.  She politely declines statin therapy and CT calcium score at this time.  She may consider CT calcium score in the future.      -we discussed possible serious and likely etiologies, options for evaluation and workup, limitations of telemedicine visit vs in person visit, treatment, treatment risks and precautions. Pt prefers to treat via telemedicine empirically rather then risking or undertaking an in person visit at this moment.    I discussed the assessment and treatment plan with the patient. The patient was provided an opportunity to ask questions and all were answered. The patient agreed with the plan and demonstrated an understanding of the instructions.   The patient was advised to call back or seek an in-person evaluation if the symptoms worsen or if the condition fails to improve as anticipated.  Advised if desired AVS can be mailed or viewed via [place] if [facility] user.   [name], FNP

## 2022-07-19 ENCOUNTER — Encounter: Payer: Self-pay | Admitting: Family

## 2022-07-19 ENCOUNTER — Other Ambulatory Visit: Payer: Self-pay | Admitting: Family

## 2022-07-19 ENCOUNTER — Telehealth: Payer: Self-pay | Admitting: Family

## 2022-07-19 DIAGNOSIS — I700 Atherosclerosis of aorta: Secondary | ICD-10-CM

## 2022-07-19 NOTE — Telephone Encounter (Signed)
Per patient's last visit on 07/07/2022 visit. She said that the referral was never put in for a CT with calcium score.

## 2022-07-19 NOTE — Telephone Encounter (Signed)
Sent mychart

## 2022-07-21 DIAGNOSIS — D0359 Melanoma in situ of other part of trunk: Secondary | ICD-10-CM | POA: Diagnosis not present

## 2022-07-21 DIAGNOSIS — L570 Actinic keratosis: Secondary | ICD-10-CM | POA: Diagnosis not present

## 2022-07-21 DIAGNOSIS — D485 Neoplasm of uncertain behavior of skin: Secondary | ICD-10-CM | POA: Diagnosis not present

## 2022-07-21 DIAGNOSIS — D225 Melanocytic nevi of trunk: Secondary | ICD-10-CM | POA: Diagnosis not present

## 2022-07-21 DIAGNOSIS — Z85828 Personal history of other malignant neoplasm of skin: Secondary | ICD-10-CM | POA: Diagnosis not present

## 2022-07-21 DIAGNOSIS — D2271 Melanocytic nevi of right lower limb, including hip: Secondary | ICD-10-CM | POA: Diagnosis not present

## 2022-07-21 DIAGNOSIS — D2262 Melanocytic nevi of left upper limb, including shoulder: Secondary | ICD-10-CM | POA: Diagnosis not present

## 2022-07-21 DIAGNOSIS — C792 Secondary malignant neoplasm of skin: Secondary | ICD-10-CM | POA: Diagnosis not present

## 2022-07-21 DIAGNOSIS — Z85820 Personal history of malignant melanoma of skin: Secondary | ICD-10-CM | POA: Diagnosis not present

## 2022-07-21 DIAGNOSIS — D0339 Melanoma in situ of other parts of face: Secondary | ICD-10-CM | POA: Diagnosis not present

## 2022-07-21 DIAGNOSIS — D234 Other benign neoplasm of skin of scalp and neck: Secondary | ICD-10-CM | POA: Diagnosis not present

## 2022-07-21 DIAGNOSIS — D2272 Melanocytic nevi of left lower limb, including hip: Secondary | ICD-10-CM | POA: Diagnosis not present

## 2022-07-27 DIAGNOSIS — D0359 Melanoma in situ of other part of trunk: Secondary | ICD-10-CM | POA: Diagnosis not present

## 2022-07-30 ENCOUNTER — Ambulatory Visit: Admission: RE | Admit: 2022-07-30 | Payer: Medicare Other | Source: Ambulatory Visit

## 2022-08-02 DIAGNOSIS — I781 Nevus, non-neoplastic: Secondary | ICD-10-CM | POA: Diagnosis not present

## 2022-08-02 DIAGNOSIS — L988 Other specified disorders of the skin and subcutaneous tissue: Secondary | ICD-10-CM | POA: Diagnosis not present

## 2022-08-02 DIAGNOSIS — D0339 Melanoma in situ of other parts of face: Secondary | ICD-10-CM | POA: Diagnosis not present

## 2022-08-02 DIAGNOSIS — C434 Malignant melanoma of scalp and neck: Secondary | ICD-10-CM | POA: Diagnosis not present

## 2022-08-02 DIAGNOSIS — Z85820 Personal history of malignant melanoma of skin: Secondary | ICD-10-CM | POA: Diagnosis not present

## 2022-08-02 DIAGNOSIS — L814 Other melanin hyperpigmentation: Secondary | ICD-10-CM | POA: Diagnosis not present

## 2022-08-03 ENCOUNTER — Institutional Professional Consult (permissible substitution): Payer: Medicare Other | Admitting: Student in an Organized Health Care Education/Training Program

## 2022-08-06 HISTORY — PX: OTHER SURGICAL HISTORY: SHX169

## 2022-08-07 ENCOUNTER — Other Ambulatory Visit: Payer: Self-pay | Admitting: Gastroenterology

## 2022-08-07 DIAGNOSIS — K22719 Barrett's esophagus with dysplasia, unspecified: Secondary | ICD-10-CM

## 2022-08-16 DIAGNOSIS — D0339 Melanoma in situ of other parts of face: Secondary | ICD-10-CM | POA: Diagnosis not present

## 2022-08-17 ENCOUNTER — Institutional Professional Consult (permissible substitution): Payer: Medicare Other | Admitting: Student in an Organized Health Care Education/Training Program

## 2022-08-25 ENCOUNTER — Other Ambulatory Visit: Payer: Self-pay

## 2022-08-30 ENCOUNTER — Ambulatory Visit (INDEPENDENT_AMBULATORY_CARE_PROVIDER_SITE_OTHER): Payer: Medicare Other | Admitting: Gastroenterology

## 2022-08-30 ENCOUNTER — Encounter: Payer: Self-pay | Admitting: Gastroenterology

## 2022-08-30 VITALS — BP 136/82 | HR 78 | Temp 98.1°F | Ht 65.0 in | Wt 139.2 lb

## 2022-08-30 DIAGNOSIS — K900 Celiac disease: Secondary | ICD-10-CM | POA: Diagnosis not present

## 2022-08-30 DIAGNOSIS — K22719 Barrett's esophagus with dysplasia, unspecified: Secondary | ICD-10-CM

## 2022-08-30 DIAGNOSIS — H903 Sensorineural hearing loss, bilateral: Secondary | ICD-10-CM | POA: Diagnosis not present

## 2022-08-30 DIAGNOSIS — H6123 Impacted cerumen, bilateral: Secondary | ICD-10-CM | POA: Diagnosis not present

## 2022-08-30 NOTE — Progress Notes (Signed)
Cephas Darby, MD [address]  [place]  [place], [place] [address]  Main: [phone]  Fax: [phone]    Gastroenterology Consultation  Referring Provider:     Burnard Hawthorne, FNP Primary Care Physician:  Burnard Hawthorne, FNP Primary Gastroenterologist:  Dr. Cephas Darby Reason for Consultation:     Celiac disease, fatty liver disease, colon adenoma        HPI:   Susan Stanley is a 72 y.o. Caucasian female referred by Dr. Vidal Schwalbe, Yvetta Coder, FNP  for consultation & management of history of celiac disease, fatty liver, colon polyp Patient reports that she was diagnosed with celiac disease in 2007 based on the blood test and endoscopy as she was experiencing bloating, diarrhea.  She went on a gluten-free diet as recommended, also consulted nutritionist during that time.  Her symptoms resolved on a gluten-free diet.  She moved from [place] about 1 and half years ago.  She had liver test performed as part of physical on 10/12/2017, found to have isolated elevated alkaline phosphatase to 149, ultrasound revealed mild hepatic steatosis.  She researched online and found association between celiac disease and elevated LFTs.  Hep C antibody was negative.  She said she was cheating on a gluten-free diet prior to this.  She then went on a strict gluten-free diet, that lead to some weight loss.  Her LFTs returned to normal. She was also found to have tubular adenoma in the rectum in 05/2014 She was taking omeprazole for heartburn which she recently stopped.  Currently, taking apple cider vinegar with garlic as needed for reflux symptoms.  She denies any GI symptoms today She does not drink alcohol or smoke cigarettes  Follow-up visit 08/30/2022 Patient is here for regular follow-up of celiac disease and fatty liver.  She has been doing well.  She has been intentionally eating gluten containing cookies intermittently without having any symptoms.  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  She does report occasional chest spasm and she thinks is from reflux only.  She does not have any other concerns today.  NSAIDs: None  Antiplts/Anticoagulants/Anti thrombotics: None  GI Procedures:  EGD and colonoscopy 10/08/2019 Submucosal nodule found in the duodenum, biopsied Small hiatal hernia Normal stomach Normal retroflexion in the stomach Salmon-colored mucosa suspicious for short segment Barrett's esophagus  Excellent prep, 3 subcentimeter polyps, pathology revealed tubular adenomas  DIAGNOSIS: A. DUODENUM; COLD BIOPSY: - ENTERIC MUCOSA WITH PRESERVED VILLOUS ARCHITECTURE AND NO SIGNIFICANT HISTOPATHOLOGIC CHANGE. - NEGATIVE FOR FEATURES OF CELIAC, DYSPLASIA, AND MALIGNANCY.  B. GASTROESOPHAGEAL JUNCTION; COLD BIOPSY: - COLUMNAR MUCOSA WITH INTESTINAL METAPLASIA, COMPATIBLE WITH BARRETT'S ESOPHAGUS. - SQUAMOUS MUCOSA WITH FEATURES OF MILD REFLUX ESOPHAGITIS. - NEGATIVE FOR DYSPLASIA AND MALIGNANCY.  C. COLON POLYP, CECUM; COLD SNARE: - TUBULAR ADENOMA. - NEGATIVE FOR HIGH-GRADE DYSPLASIA AND MALIGNANCY.  D. COLON POLYPS X2, TRANSVERSE; COLD SNARE: - MULTIPLE FRAGMENTS OF TUBULAR ADENOMAS. - NEGATIVE FOR HIGH-GRADE DYSPLASIA AND MALIGNANCY.  E. COLON POLYP, DESCENDING; COLD BIOPSY: - TUBULAR ADENOMA. - NEGATIVE FOR HIGH-GRADE DYSPLASIA AND MALIGNANCY.     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   Celiac disease    x20 years   Chicken pox    Hypertension     Past Surgical History:  Procedure Laterality Date   APPENDECTOMY      COLONOSCOPY WITH PROPOFOL N/A 10/08/2019   Procedure: COLONOSCOPY WITH PROPOFOL;  Surgeon: Lin Landsman, MD;  Location: [facility] ENDOSCOPY;  Service: Gastroenterology;  Laterality: N/A;   ESOPHAGOGASTRODUODENOSCOPY (EGD) WITH PROPOFOL N/A 10/08/2019   Procedure: ESOPHAGOGASTRODUODENOSCOPY (EGD) WITH PROPOFOL;  Surgeon: Lin Landsman, MD;  Location: [facility] ENDOSCOPY;  Service: Gastroenterology;  Laterality: N/A;   melanoma removal  2011   top of head     Current Outpatient Medications:    amLODipine (NORVASC) 2.5 MG tablet, Take 1 tablet (2.5 mg total) by mouth daily., Disp: 90 tablet, Rfl: 3   Calcium 600-5 MG-MCG TABS, Take 1 tablet by mouth daily., Disp: , Rfl:    Cholecalciferol 50 MCG (2000 UT) TABS, Take 1 tablet by mouth daily., Disp: , Rfl:    Omega-3 Fatty Acids (FISH OIL) 1000 MG CAPS, Take by mouth., Disp: , Rfl:    omeprazole (PRILOSEC) 20 MG capsule, TAKE 1 CAPSULE BY MOUTH EVERYDAY AT BEDTIME, Disp: 90 capsule, Rfl: 0  Family History  Problem Relation Age of Onset   Alcohol abuse Mother    COPD Mother    Alcohol abuse Father    Cancer Father        lung   Hypertension Father    Alcohol abuse Sister    Diabetes Maternal Grandfather    Arthritis Paternal Grandmother    Cancer Paternal Grandfather        Colon   Breast cancer Neg Hx      Social History   Tobacco Use   Smoking status: Never   Smokeless tobacco: Never  Vaping Use   Vaping Use: Never used  Substance Use Topics   Alcohol use: No   Drug use: No    Allergies as of 08/30/2022 - Review Complete 07/07/2022  Allergen Reaction Noted   Penicillin g  01/20/2017    Review of Systems:    All systems reviewed and negative except where noted in HPI.   Physical Exam:  BP 136/82 (BP Location: Left Arm, Patient Position: Sitting, Cuff Size: Normal)   Pulse 78   Temp 98.1 F (36.7 C) (Oral)   Ht 5\' 5"  (1.651 m)   Wt 139 lb 4 oz (63.2 kg)   BMI 23.17 kg/m  No LMP recorded. Patient is  postmenopausal.  General:   Alert,  Well-developed, well-nourished, pleasant and cooperative in NAD Head:  Normocephalic and atraumatic. Eyes:  Sclera clear, no icterus.   Conjunctiva pink. Ears:  Normal auditory acuity. Nose:  No deformity, discharge, or lesions. Mouth:  No deformity or lesions,oropharynx pink & moist. Neck:  Supple; no masses or thyromegaly. Lungs:  Respirations even and unlabored.  Clear throughout to auscultation.   No wheezes, crackles, or rhonchi. No acute distress. Heart:  Regular rate and rhythm; no murmurs, clicks, rubs, or gallops. Abdomen:  Normal bowel sounds. Soft, non-tender and non-distended without masses, hepatosplenomegaly or hernias noted.  No guarding or rebound tenderness.   Rectal: Not performed Msk:  Symmetrical without gross deformities. Good, equal movement & strength bilaterally. Pulses:  Normal pulses noted. Extremities:  No clubbing or edema.  No cyanosis. Neurologic:  Alert and oriented x3;  grossly normal neurologically. Skin:  Intact without significant lesions or rashes. No jaundice. Psych:  Alert and cooperative. Normal mood and affect.  Imaging Studies: Reviewed  Assessment and Plan:   Susan Stanley is a 72 y.o. Caucasian female with reported history of celiac disease, currently in endoscopy and clinical remission on a gluten-free diet, mild fatty liver, tubular adenoma of the colon  Celiac disease: Endoscopic remission based on duodenal biopsies in 2021 Continue gluten-free diet Offered to check celiac serologies, patient declined  Hepatic steatosis and isolated elevated alkaline phosphatase There is certainly an association between celiac disease and elevated LFTs LFTs are now normal on strict gluten-free diet Hep C antibody negative No further work-up needed at this time Continue healthy lifestyle, patient is reassured  Short segment Barrett's esophagus without dysplasia Intermittent chest spasms Patient would like to continue  omeprazole 20 mg before dinner Advised patient to increase to 40 mg if symptoms are more frequent and let me know so that we could repeat upper endoscopy  Tubular adenomas: Repeat surveillance colonoscopy in 2026   Follow up as needed   Cephas Darby, MD

## 2022-08-31 DIAGNOSIS — C439 Malignant melanoma of skin, unspecified: Secondary | ICD-10-CM | POA: Diagnosis not present

## 2022-08-31 DIAGNOSIS — K22719 Barrett's esophagus with dysplasia, unspecified: Secondary | ICD-10-CM | POA: Diagnosis not present

## 2022-08-31 DIAGNOSIS — R911 Solitary pulmonary nodule: Secondary | ICD-10-CM | POA: Diagnosis not present

## 2022-08-31 DIAGNOSIS — K900 Celiac disease: Secondary | ICD-10-CM | POA: Diagnosis not present

## 2022-08-31 DIAGNOSIS — R768 Other specified abnormal immunological findings in serum: Secondary | ICD-10-CM | POA: Diagnosis not present

## 2022-08-31 DIAGNOSIS — I10 Essential (primary) hypertension: Secondary | ICD-10-CM | POA: Diagnosis not present

## 2022-08-31 DIAGNOSIS — C434 Malignant melanoma of scalp and neck: Secondary | ICD-10-CM | POA: Diagnosis not present

## 2022-09-07 DIAGNOSIS — C434 Malignant melanoma of scalp and neck: Secondary | ICD-10-CM | POA: Diagnosis not present

## 2022-09-09 ENCOUNTER — Encounter: Payer: Self-pay | Admitting: Family

## 2022-09-09 ENCOUNTER — Other Ambulatory Visit: Payer: Self-pay | Admitting: Family

## 2022-09-09 DIAGNOSIS — R946 Abnormal results of thyroid function studies: Secondary | ICD-10-CM

## 2022-09-09 DIAGNOSIS — R948 Abnormal results of function studies of other organs and systems: Secondary | ICD-10-CM

## 2022-09-10 ENCOUNTER — Other Ambulatory Visit (INDEPENDENT_AMBULATORY_CARE_PROVIDER_SITE_OTHER): Payer: Medicare Other

## 2022-09-10 DIAGNOSIS — R946 Abnormal results of thyroid function studies: Secondary | ICD-10-CM

## 2022-09-10 DIAGNOSIS — R948 Abnormal results of function studies of other organs and systems: Secondary | ICD-10-CM

## 2022-09-10 LAB — TSH: TSH: 2 u[IU]/mL (ref 0.35–5.50)

## 2022-09-10 LAB — T4, FREE: Free T4: 0.86 ng/dL (ref 0.60–1.60)

## 2022-09-10 LAB — T3, FREE: T3, Free: 3.1 pg/mL (ref 2.3–4.2)

## 2022-09-13 ENCOUNTER — Telehealth: Payer: Self-pay

## 2022-09-13 ENCOUNTER — Inpatient Hospital Stay
Admission: RE | Admit: 2022-09-13 | Discharge: 2022-09-13 | Disposition: A | Discharge status: Home / Self Care | Payer: Self-pay | Source: Ambulatory Visit | Attending: Student in an Organized Health Care Education/Training Program | Admitting: Student in an Organized Health Care Education/Training Program

## 2022-09-13 ENCOUNTER — Encounter: Payer: Self-pay | Admitting: Student in an Organized Health Care Education/Training Program

## 2022-09-13 ENCOUNTER — Ambulatory Visit (INDEPENDENT_AMBULATORY_CARE_PROVIDER_SITE_OTHER): Payer: Medicare Other | Admitting: Student in an Organized Health Care Education/Training Program

## 2022-09-13 VITALS — BP 130/78 | HR 100 | Temp 97.8°F | Ht 65.0 in | Wt 137.8 lb

## 2022-09-13 DIAGNOSIS — R911 Solitary pulmonary nodule: Secondary | ICD-10-CM

## 2022-09-13 NOTE — Telephone Encounter (Signed)
Received call back with [facility]. Transferred to [employer]. Spoke to [place] with [facility] and was advised to fax request to [phone].

## 2022-09-13 NOTE — Progress Notes (Unsigned)
Synopsis: Referred in for lung nodule by Allegra GranaArnett, Margaret G, FNP  Assessment & Plan:   #Pulmonary Nodules  She is presenting for the evaluation of two micronodules that appear to be benign on my review. The nodules, in sagital and coronal view appear to be tracking alongside a vessel and airway. This is supportive of a benign etiology. We have attempted to obtain her imaging, and I've requested the [facility] as well as previous imaging from [place] through [facility].  She tells me that her [facility] providers are going to be obtaining PET/CTs every 6 months for the next 2 years which could be sufficient to follow-up these micronodules if they have associated CT's. Should that not be the case, we will have to obtain a repeat CT scan of the chest in 1 year.  Should they exhibit any growth and/or she have other PET avid findings, we would consider further diagnostics.  -[facility] -[facility] repeat CT chest in 1 year  Return in about 1 year (around 09/13/2023).  I spent 60 minutes caring for this patient today, including preparing to see the patient, obtaining a medical history , reviewing a separately obtained history, performing a medically appropriate examination and/or evaluation, counseling and educating the patient/family/caregiver, referring and communicating with other health care professionals (not separately reported), documenting clinical information in the electronic health record, and independently interpreting results (not separately reported/billed) and communicating results to the patient/family/caregiver  Raechel ChuteKhabib Shantel Helwig, MD [facility] 09/13/2022 9:51 AM    End of visit medications:  No orders of the defined types were placed in this encounter.    Current Outpatient Medications:    amLODipine (NORVASC) 2.5 MG tablet, Take 1 tablet (2.5 mg total) by mouth daily., Disp: 90 tablet, Rfl: 3   Calcium 600-5 MG-MCG TABS, Take 1 tablet by  mouth daily., Disp: , Rfl:    Cholecalciferol 50 MCG (2000 UT) TABS, Take 1 tablet by mouth daily., Disp: , Rfl:    Omega-3 Fatty Acids (FISH OIL) 1000 MG CAPS, Take by mouth., Disp: , Rfl:    omeprazole (PRILOSEC) 20 MG capsule, TAKE 1 CAPSULE BY MOUTH EVERYDAY AT BEDTIME, Disp: 90 capsule, Rfl: 0   Subjective:   PATIENT ID: Susan Stanley GENDER: female DOB: December 08, 1950, MRN: [id number]  Chief Complaint  Patient presents with   Pulmonary consult    CT 1/17-no current sx.     HPI  The patient is a pleasant 72 year old female presenting to clinic for the evaluation of pulmonary nodules.  Patient was in her usual state of health and was reporting some right shoulder pain for which she got an xray by her primary care physician This was followed by CT scan of the chest that showed 2 micronodules, one in the right middle lobe and one in the right lower lobe.  Patient has a history of melanoma that has been dealing with for over a decade and has had multiple biopsies and resections of skin lesions on her scalp and face and body. She [facility] [place], and has since [facility]. She had chest imaging while she was living in [place] and there was no report of pulmonary nodules.  [place] to [place] in 2018 to be closer to her daughter.  She established care here and has been following [facility].  She recently had a PET/CT (reviewed report in care everywhere) that  was negative.  Furthermore, Care Everywhere shows her to have had CT scans of the chest in 2015 and 2016 at the [facility] medicine system which were also negative.  Patient is a lifelong non-smoker and previously worked an Production designer, theatre/television/film. No exposures reported.  Ancillary information including prior medications, full medical/surgical/family/social histories, and PFTs (when available) are listed below and have been reviewed.   Review of Systems  Constitutional:   Negative for chills, fever and weight loss.  Respiratory:  Negative for cough, hemoptysis, sputum production and shortness of breath.   Cardiovascular:  Negative for chest pain.     Objective:   Vitals:   09/13/22 0924  BP: 130/78  Pulse: 100  Temp: 97.8 F (36.6 C)  TempSrc: Temporal  SpO2: 98%  Weight: 137 lb 12.8 oz (62.5 kg)  Height: 5\' 5"  (1.651 m)   98% on RA BMI Readings from Last 3 Encounters:  09/13/22 22.93 kg/m  08/30/22 23.17 kg/m  06/14/22 23.26 kg/m   Wt Readings from Last 3 Encounters:  09/13/22 137 lb 12.8 oz (62.5 kg)  08/30/22 139 lb 4 oz (63.2 kg)  06/14/22 139 lb 12.8 oz (63.4 kg)    Physical Exam Constitutional:      Appearance: Normal appearance.  HENT:     Mouth/Throat:     Mouth: Mucous membranes are moist.  Cardiovascular:     Rate and Rhythm: Normal rate and regular rhythm.     Pulses: Normal pulses.     Heart sounds: Normal heart sounds.  Pulmonary:     Effort: Pulmonary effort is normal.     Breath sounds: Normal breath sounds. No wheezing, rhonchi or rales.  Abdominal:     Palpations: Abdomen is soft.  Neurological:     General: No focal deficit present.     Mental Status: She is alert and oriented to person, place, and time. Mental status is at baseline.       Ancillary Information    Past Medical History:  Diagnosis Date   Cancer    atypical melanoma   Celiac disease    x20 years   Chicken pox    Hypertension      Family History  Problem Relation Age of Onset   Alcohol abuse Mother    COPD Mother    Alcohol abuse Father    Cancer Father        lung   Hypertension Father    Alcohol abuse Sister    Diabetes Maternal Grandfather    Arthritis Paternal Grandmother    Cancer Paternal Grandfather        Colon   Breast cancer Neg Hx      Past Surgical History:  Procedure Laterality Date   APPENDECTOMY     COLONOSCOPY WITH PROPOFOL N/A 10/08/2019   Procedure: COLONOSCOPY WITH PROPOFOL;  Surgeon: Toney Reil, MD;  Location: [facility] ENDOSCOPY;  Service: Gastroenterology;  Laterality: N/A;   ESOPHAGOGASTRODUODENOSCOPY (EGD) WITH PROPOFOL N/A 10/08/2019   Procedure: ESOPHAGOGASTRODUODENOSCOPY (EGD) WITH PROPOFOL;  Surgeon: Toney Reil, MD;  Location: [facility] ENDOSCOPY;  Service: Gastroenterology;  Laterality: N/A;   melanoma removal  2011   top of head    Social History   Socioeconomic History   Marital status: Divorced    Spouse name: Not on file   Number of children: Not on file   Years of education: Not on file   Highest education level: Not on file  Occupational History   Not on file  Tobacco Use  Smoking status: Never   Smokeless tobacco: Never  Vaping Use   Vaping Use: Never used  Substance and Sexual Activity   Alcohol use: No   Drug use: No   Sexual activity: Not Currently    Birth control/protection: Post-menopausal  Other Topics Concern   Not on file  Social History Narrative   Daughter is Rockney Ghee      From [place]            Social Determinants of Health   Financial Resource Strain: Low Risk  (12/03/2021)   Overall Financial Resource Strain (CARDIA)    Difficulty of Paying Living Expenses: Not hard at all  Food Insecurity: No Food Insecurity (12/03/2021)   Hunger Vital Sign    Worried About Running Out of Food in the Last Year: Never true    Ran Out of Food in the Last Year: Never true  Transportation Needs: No Transportation Needs (12/03/2021)   PRAPARE - Administrator, Civil Service (Medical): No    Lack of Transportation (Non-Medical): No  Physical Activity: Sufficiently Active (12/03/2021)   Exercise Vital Sign    Days of Exercise per Week: 5 days    Minutes of Exercise per Session: 30 min  Stress: Not on file  Social Connections: Unknown (12/03/2021)   Social Connection and Isolation Panel [NHANES]    Frequency of Communication with Friends and Family: More than three times a week    Frequency of Social Gatherings with Friends  and Family: More than three times a week    Attends Religious Services: Not on file    Active Member of Clubs or Organizations: Not on file    Attends Banker Meetings: Not on file    Marital Status: Not on file  Intimate Partner Violence: Not At Risk (12/03/2021)   Humiliation, Afraid, Rape, and Kick questionnaire    Fear of Current or Ex-Partner: No    Emotionally Abused: No    Physically Abused: No    Sexually Abused: No     Allergies  Allergen Reactions   Penicillin G     Other reaction(s): Unknown Pt had a reaction when she was a baby but doesn't know the reaction or severity.     CBC    Component Value Date/Time   WBC 4.1 03/31/2021 1047   RBC 4.66 03/31/2021 1047   HGB 14.1 03/31/2021 1047   HCT 42.9 03/31/2021 1047   PLT 299.0 03/31/2021 1047   MCV 92.1 03/31/2021 1047   MCH 30.1 01/26/2019 1701   MCHC 32.7 03/31/2021 1047   RDW 13.1 03/31/2021 1047   LYMPHSABS 1.2 03/31/2021 1047   MONOABS 0.3 03/31/2021 1047   EOSABS 0.1 03/31/2021 1047   BASOSABS 0.0 03/31/2021 1047    Pulmonary Functions Testing Results:     No data to display          Outpatient Medications Prior to Visit  Medication Sig Dispense Refill   amLODipine (NORVASC) 2.5 MG tablet Take 1 tablet (2.5 mg total) by mouth daily. 90 tablet 3   Calcium 600-5 MG-MCG TABS Take 1 tablet by mouth daily.     Cholecalciferol 50 MCG (2000 UT) TABS Take 1 tablet by mouth daily.     Omega-3 Fatty Acids (FISH OIL) 1000 MG CAPS Take by mouth.     omeprazole (PRILOSEC) 20 MG capsule TAKE 1 CAPSULE BY MOUTH EVERYDAY AT BEDTIME 90 capsule 0   No facility-administered medications prior to visit.

## 2022-09-13 NOTE — Telephone Encounter (Signed)
Fax request has been sent to [facility].

## 2022-09-13 NOTE — Telephone Encounter (Signed)
Per Dr. Aundria Rud verbally- request PET from [facility] and CT's from [facility] health.  PET has been received from [facility] via [facility].  Unable to locate [facility] in power share. Contacted [place] radiology department at [phone] and was advised that wait time was >61min. Lm for call back.

## 2022-09-14 NOTE — Telephone Encounter (Signed)
Spoke [place] with [facility]. [facility] imaging will be uploaded to epic. Imaging from [facility] has not been shared. Will continue to f/u.

## 2022-09-15 NOTE — Telephone Encounter (Signed)
PET has been uploaded in epic.   Spoke to patient. She stated that she is returning a call from Dr. Aundria Rud.    Please advise. Thanks

## 2022-09-15 NOTE — Telephone Encounter (Signed)
Patient is returning phone call. Patient phone number is [phone].

## 2022-09-21 NOTE — Telephone Encounter (Signed)
Dr. Dgayli, please advise. Thanks   

## 2022-09-21 NOTE — Telephone Encounter (Signed)
Imaging has not been uploaded to [facility] from [place]. Re sent record request via fax.

## 2022-09-23 NOTE — Telephone Encounter (Signed)
Spoke to [place] with [facility]. She stated that [facility] has not shared imaging at this time.  Will resend fax request.

## 2022-09-27 ENCOUNTER — Inpatient Hospital Stay
Admission: RE | Admit: 2022-09-27 | Discharge: 2022-09-27 | Disposition: A | Discharge status: Home / Self Care | Payer: Self-pay | Source: Ambulatory Visit | Attending: Student in an Organized Health Care Education/Training Program | Admitting: Student in an Organized Health Care Education/Training Program

## 2022-09-27 DIAGNOSIS — R911 Solitary pulmonary nodule: Secondary | ICD-10-CM

## 2022-09-27 NOTE — Telephone Encounter (Signed)
Toniann Fail with [facility] stated that images have not been shared from [facility].  Request has been re faxed.

## 2022-10-04 ENCOUNTER — Other Ambulatory Visit: Payer: Self-pay | Admitting: Student in an Organized Health Care Education/Training Program

## 2022-10-04 NOTE — Telephone Encounter (Signed)
ATC the patient. LVM for patient to return my call. 

## 2022-10-04 NOTE — Progress Notes (Signed)
Brief Pulmonary Note:  We received a disc by female from [facility] with patient's chest CT from 2016. I reviewed the images and compared them to her recent chest CT from 2024. Reviewed and noted said micronodules in the RML and RLL. Given the nodule was present in 2024, no need for repeat imaging.     Raechel Chute, MD [facility] 10/04/2022 4:06 PM

## 2022-10-04 NOTE — Telephone Encounter (Signed)
We received a disc with the images from [facility]. They were given to Dr. Aundria Rud.

## 2022-10-07 NOTE — Telephone Encounter (Addendum)
I have left another message for the patient. I have sent her a letter notifying her.  Synetta Fail- please cancel the patient's CT.  Nothing further needed.

## 2022-10-08 NOTE — Telephone Encounter (Signed)
It wasn't scheduled yet. I wait to closer to the time the CT would be due

## 2022-10-18 NOTE — Addendum Note (Signed)
Encounter addended by: Tanja Port on: 10/18/2022 11:28 AM  Actions taken: Imaging Exam ended

## 2022-11-05 ENCOUNTER — Other Ambulatory Visit: Payer: Self-pay | Admitting: Gastroenterology

## 2022-11-05 DIAGNOSIS — K22719 Barrett's esophagus with dysplasia, unspecified: Secondary | ICD-10-CM

## 2022-11-10 DIAGNOSIS — Z08 Encounter for follow-up examination after completed treatment for malignant neoplasm: Secondary | ICD-10-CM | POA: Diagnosis not present

## 2022-11-10 DIAGNOSIS — D2261 Melanocytic nevi of right upper limb, including shoulder: Secondary | ICD-10-CM | POA: Diagnosis not present

## 2022-11-10 DIAGNOSIS — D2262 Melanocytic nevi of left upper limb, including shoulder: Secondary | ICD-10-CM | POA: Diagnosis not present

## 2022-11-10 DIAGNOSIS — Z85820 Personal history of malignant melanoma of skin: Secondary | ICD-10-CM | POA: Diagnosis not present

## 2022-11-10 DIAGNOSIS — D2271 Melanocytic nevi of right lower limb, including hip: Secondary | ICD-10-CM | POA: Diagnosis not present

## 2022-11-10 DIAGNOSIS — Z85828 Personal history of other malignant neoplasm of skin: Secondary | ICD-10-CM | POA: Diagnosis not present

## 2022-11-10 DIAGNOSIS — D2272 Melanocytic nevi of left lower limb, including hip: Secondary | ICD-10-CM | POA: Diagnosis not present

## 2022-11-10 DIAGNOSIS — D485 Neoplasm of uncertain behavior of skin: Secondary | ICD-10-CM | POA: Diagnosis not present

## 2022-11-10 DIAGNOSIS — L821 Other seborrheic keratosis: Secondary | ICD-10-CM | POA: Diagnosis not present

## 2022-11-10 DIAGNOSIS — D225 Melanocytic nevi of trunk: Secondary | ICD-10-CM | POA: Diagnosis not present

## 2022-11-26 ENCOUNTER — Other Ambulatory Visit: Payer: Self-pay | Admitting: Student in an Organized Health Care Education/Training Program

## 2022-11-26 NOTE — Progress Notes (Signed)
Called patient - she is aware of my previous recommendations that the nodules are stable compared to prior (on CT from [place] from 2016). CT chest cancelled, no need for further follow up of the nodule.

## 2022-11-30 DIAGNOSIS — Z961 Presence of intraocular lens: Secondary | ICD-10-CM | POA: Diagnosis not present

## 2022-11-30 DIAGNOSIS — H169 Unspecified keratitis: Secondary | ICD-10-CM | POA: Diagnosis not present

## 2022-12-04 ENCOUNTER — Ambulatory Visit
Admission: EM | Admit: 2022-12-04 | Discharge: 2022-12-04 | Disposition: A | Discharge status: Home / Self Care | Payer: Medicare Other | Attending: Emergency Medicine | Admitting: Emergency Medicine

## 2022-12-04 DIAGNOSIS — H9202 Otalgia, left ear: Secondary | ICD-10-CM | POA: Diagnosis not present

## 2022-12-04 DIAGNOSIS — H60502 Unspecified acute noninfective otitis externa, left ear: Secondary | ICD-10-CM

## 2022-12-04 MED ORDER — OFLOXACIN 0.3 % OT SOLN: 10.0000 [drp] | Freq: Every day | OTIC | 0 refills | Status: AC

## 2022-12-04 NOTE — ED Triage Notes (Signed)
Patient to Urgent Care with complaints of left sided ear pain that started five days ago. Reports initially believing that she had some TMJ pain but pain radiates into her ear.   Denies any drainage or fevers. Hearing is muffled.

## 2022-12-04 NOTE — ED Provider Notes (Signed)
Susan Stanley    CSN: [id number] Arrival date & time: 12/04/22  4098      History   Chief Complaint Chief Complaint  Patient presents with   Otalgia    HPI Susan Stanley is a 72 y.o. female.  Patient presents with 5-day history of left ear pain.  No fever, chills, ear drainage, sore throat, cough, shortness of breath, or other symptoms.  No OTC medications taken today.  Her medical history includes celiac disease, hypertension, melanoma, osteoporosis, GERD, Barrett's esophagus.  The history is provided by the patient and medical records.    Past Medical History:  Diagnosis Date   Cancer (HCC)    atypical melanoma   Celiac disease    x20 years   Chicken pox    Hypertension     Patient Active Problem List   Diagnosis Date Noted   Lung nodule 07/07/2022   Aortic atherosclerosis (HCC) 06/18/2022   Headache 06/15/2022   Barrett's esophagus 10/12/2019   Celiac disease 01/12/2017   Osteoporosis 01/12/2017   HTN (hypertension) 01/12/2017   History of melanoma 01/12/2017   GERD (gastroesophageal reflux disease) 01/12/2017    Past Surgical History:  Procedure Laterality Date   APPENDECTOMY     COLONOSCOPY WITH PROPOFOL N/A 10/08/2019   Procedure: COLONOSCOPY WITH PROPOFOL;  Surgeon: Toney Reil, MD;  Location: [facility] ENDOSCOPY;  Service: Gastroenterology;  Laterality: N/A;   ESOPHAGOGASTRODUODENOSCOPY (EGD) WITH PROPOFOL N/A 10/08/2019   Procedure: ESOPHAGOGASTRODUODENOSCOPY (EGD) WITH PROPOFOL;  Surgeon: Toney Reil, MD;  Location: [facility] ENDOSCOPY;  Service: Gastroenterology;  Laterality: N/A;   melanoma removal  2011   top of head   MOHS SURGERY Left    Face    OB History     Gravida  3   Para  2   Term  2   Preterm      AB  1   Living  2      SAB      IAB      Ectopic      Multiple      Live Births               Home Medications    Prior to Admission medications   Medication Sig Start Date End Date Taking?  Authorizing Provider  ofloxacin (FLOXIN) 0.3 % OTIC solution Place 10 drops into the left ear daily for 7 days. 12/04/22 12/11/22 Yes Mickie Bail, NP  amLODipine (NORVASC) 2.5 MG tablet Take 1 tablet (2.5 mg total) by mouth daily. 05/14/22   Allegra Grana, FNP  Calcium 600-5 MG-MCG TABS Take 1 tablet by mouth daily.    [provider]  Cholecalciferol 50 MCG (2000 UT) TABS Take 1 tablet by mouth daily.    [provider]  Omega-3 Fatty Acids (FISH OIL) 1000 MG CAPS Take by mouth.    [provider]  omeprazole (PRILOSEC) 20 MG capsule TAKE 1 CAPSULE BY MOUTH EVERYDAY AT BEDTIME 11/08/22   [name], [name], MD    Family History Family History  Problem Relation Age of Onset   Alcohol abuse Mother    COPD Mother    Alcohol abuse Father    Cancer Father        lung   Hypertension Father    Alcohol abuse Sister    Diabetes Maternal Grandfather    Arthritis Paternal Grandmother    Cancer Paternal Grandfather        Colon   Breast cancer Neg Hx  Social History Social History   Tobacco Use   Smoking status: Never   Smokeless tobacco: Never  Vaping Use   Vaping Use: Never used  Substance Use Topics   Alcohol use: No   Drug use: No     Allergies   Penicillin g   Review of Systems Review of Systems  Constitutional:  Negative for chills and fever.  HENT:  Positive for ear pain. Negative for ear discharge and sore throat.   Respiratory:  Negative for cough and shortness of breath.      Physical Exam Triage Vital Signs ED Triage Vitals  Enc Vitals Group     BP 12/04/22 0855 110/70     Pulse Rate 12/04/22 0841 (!) 110     Resp 12/04/22 0841 18     Temp 12/04/22 0841 98.4 F (36.9 C)     Temp src --      SpO2 12/04/22 0841 97 %     Weight --      Height --      Head Circumference --      Peak Flow --      Pain Score 12/04/22 0851 1     Pain Loc --      Pain Edu? --      Excl. in GC? --    No data found.  Updated Vital  Signs BP 110/70   Pulse (!) 110   Temp 98.4 F (36.9 C)   Resp 18   SpO2 97%   Visual Acuity Right Eye Distance:   Left Eye Distance:   Bilateral Distance:    Right Eye Near:   Left Eye Near:    Bilateral Near:     Physical Exam Vitals and nursing note reviewed.  Constitutional:      General: She is not in acute distress.    Appearance: She is well-developed.  HENT:     Right Ear: Tympanic membrane and ear canal normal.     Left Ear: Drainage and tenderness present.     Nose: Nose normal.     Mouth/Throat:     Mouth: Mucous membranes are moist.     Pharynx: Oropharynx is clear.  Cardiovascular:     Rate and Rhythm: Normal rate and regular rhythm.     Heart sounds: Normal heart sounds.  Pulmonary:     Effort: Pulmonary effort is normal. No respiratory distress.     Breath sounds: Normal breath sounds.  Musculoskeletal:     Cervical back: Neck supple.  Skin:    General: Skin is warm and dry.  Neurological:     Mental Status: She is alert.  Psychiatric:        Mood and Affect: Mood normal.        Behavior: Behavior normal.      UC Treatments / Results  Labs (all labs ordered are listed, but only abnormal results are displayed) Labs Reviewed - No data to display  EKG   Radiology No results found.  Procedures Procedures (including critical care time)  Medications Ordered in UC Medications - No data to display  Initial Impression / Assessment and Plan / UC Course  I have reviewed the triage vital signs and the nursing notes.  Pertinent labs & imaging results that were available during my care of the patient were reviewed by me and considered in my medical decision making (see chart for details).    Left otitis externa and otalgia.  Treating with ofloxacin eardrops.  Education provided  on otitis externa and earache.  Instructed patient to follow up with her PCP or ENT if her symptoms are not improving.  She agrees to plan of care.    Final Clinical  Impressions(s) / UC Diagnoses   Final diagnoses:  Acute otitis externa of left ear, unspecified type  Acute pain of left ear     Discharge Instructions      Use the ear drops as directed.  Follow up with your primary care provider or ENT if your symptoms are not improving.        ED Prescriptions     Medication Sig Dispense Auth. Provider   ofloxacin (FLOXIN) 0.3 % OTIC solution Place 10 drops into the left ear daily for 7 days. 5 mL Mickie Bail, NP      PDMP not reviewed this encounter.   Mickie Bail, NP 12/04/22 [phone]

## 2022-12-04 NOTE — Discharge Instructions (Addendum)
Use the eardrops as directed.  Follow-up with your primary care provider or ENT if your symptoms are not improving. ?

## 2022-12-13 ENCOUNTER — Ambulatory Visit (INDEPENDENT_AMBULATORY_CARE_PROVIDER_SITE_OTHER): Payer: Medicare Other | Admitting: *Deleted

## 2022-12-13 VITALS — Ht 65.0 in | Wt 138.0 lb

## 2022-12-13 DIAGNOSIS — Z0000 Encounter for general adult medical examination without abnormal findings: Secondary | ICD-10-CM

## 2022-12-13 NOTE — Patient Instructions (Signed)
Susan Stanley , Thank you for taking time to come for your Medicare Wellness Visit. I appreciate your ongoing commitment to your health goals. Please review the following plan we discussed and let me know if I can assist you in the future.   These are the goals we discussed:  Goals      Maintain healthy lifestyle     Healthy diet Stay active     Patient Stated     Patient wants to work on her diet        This is a list of the screening recommended for you and due dates:  Health Maintenance  Topic Date Due   Flu Shot  01/06/2023   Medicare Annual Wellness Visit  12/13/2023   Colon Cancer Screening  10/07/2024   DTaP/Tdap/Td vaccine (2 - Td or Tdap) 03/08/2026   Pneumonia Vaccine  Completed   DEXA scan (bone density measurement)  Completed   Hepatitis C Screening  Completed   Zoster (Shingles) Vaccine  Completed   HPV Vaccine  Aged Out   Mammogram  Discontinued   COVID-19 Vaccine  Discontinued    Advanced directives: Updating and will bring to the office  Conditions/risks identified: none  Next appointment: Follow up in one year for your annual wellness visit Patient declined to scheduled at this time because of her busy schedule   Preventive Care 29 Years and Older, Female Preventive care refers to lifestyle choices and visits with your health care provider that can promote health and wellness. What does preventive care include? A yearly physical exam. This is also called an annual well check. Dental exams once or twice a year. Routine eye exams. Ask your health care provider how often you should have your eyes checked. Personal lifestyle choices, including: Daily care of your teeth and gums. Regular physical activity. Eating a healthy diet. Avoiding tobacco and drug use. Limiting alcohol use. Practicing safe sex. Taking low-dose aspirin every day. Taking vitamin and mineral supplements as recommended by your health care provider. What happens during an annual well  check? The services and screenings done by your health care provider during your annual well check will depend on your age, overall health, lifestyle risk factors, and family history of disease. Counseling  Your health care provider may ask you questions about your: Alcohol use. Tobacco use. Drug use. Emotional well-being. Home and relationship well-being. Sexual activity. Eating habits. History of falls. Memory and ability to understand (cognition). Work and work Astronomer. Reproductive health. Screening  You may have the following tests or measurements: Height, weight, and BMI. Blood pressure. Lipid and cholesterol levels. These may be checked every 5 years, or more frequently if you are over 30 years old. Skin check. Lung cancer screening. You may have this screening every year starting at age 28 if you have a 30-pack-year history of smoking and currently smoke or have quit within the past 15 years. Fecal occult blood test (FOBT) of the stool. You may have this test every year starting at age 40. Flexible sigmoidoscopy or colonoscopy. You may have a sigmoidoscopy every 5 years or a colonoscopy every 10 years starting at age 54. Hepatitis C blood test. Hepatitis B blood test. Sexually transmitted disease (STD) testing. Diabetes screening. This is done by checking your blood sugar (glucose) after you have not eaten for a while (fasting). You may have this done every 1-3 years. Bone density scan. This is done to screen for osteoporosis. You may have this done starting at age 63.  Mammogram. This may be done every 1-2 years. Talk to your health care provider about how often you should have regular mammograms. Talk with your health care provider about your test results, treatment options, and if necessary, the need for more tests. Vaccines  Your health care provider may recommend certain vaccines, such as: Influenza vaccine. This is recommended every year. Tetanus, diphtheria, and  acellular pertussis (Tdap, Td) vaccine. You may need a Td booster every 10 years. Zoster vaccine. You may need this after age 23. Pneumococcal 13-valent conjugate (PCV13) vaccine. One dose is recommended after age 40. Pneumococcal polysaccharide (PPSV23) vaccine. One dose is recommended after age 33. Talk to your health care provider about which screenings and vaccines you need and how often you need them. This information is not intended to replace advice given to you by your health care provider. Make sure you discuss any questions you have with your health care provider. Document Released: 06/20/2015 Document Revised: 02/11/2016 Document Reviewed: 03/25/2015 Elsevier Interactive Patient Education  2017 [employer].  Fall Prevention in the Home Falls can cause injuries. They can happen to people of all ages. There are many things you can do to make your home safe and to help prevent falls. What can I do on the outside of my home? Regularly fix the edges of walkways and driveways and fix any cracks. Remove anything that might make you trip as you walk through a door, such as a raised step or threshold. Trim any bushes or trees on the path to your home. Use bright outdoor lighting. Clear any walking paths of anything that might make someone trip, such as rocks or tools. Regularly check to see if handrails are loose or broken. Make sure that both sides of any steps have handrails. Any raised decks and porches should have guardrails on the edges. Have any leaves, snow, or ice cleared regularly. Use sand or salt on walking paths during winter. Clean up any spills in your garage right away. This includes oil or grease spills. What can I do in the bathroom? Use night lights. Install grab bars by the toilet and in the tub and shower. Do not use towel bars as grab bars. Use non-skid mats or decals in the tub or shower. If you need to sit down in the shower, use a plastic, non-slip stool. Keep  the floor dry. Clean up any water that spills on the floor as soon as it happens. Remove soap buildup in the tub or shower regularly. Attach bath mats securely with double-sided non-slip rug tape. Do not have throw rugs and other things on the floor that can make you trip. What can I do in the bedroom? Use night lights. Make sure that you have a light by your bed that is easy to reach. Do not use any sheets or blankets that are too big for your bed. They should not hang down onto the floor. Have a firm chair that has side arms. You can use this for support while you get dressed. Do not have throw rugs and other things on the floor that can make you trip. What can I do in the kitchen? Clean up any spills right away. Avoid walking on wet floors. Keep items that you use a lot in easy-to-reach places. If you need to reach something above you, use a strong step stool that has a grab bar. Keep electrical cords out of the way. Do not use floor polish or wax that makes floors slippery. If  you must use wax, use non-skid floor wax. Do not have throw rugs and other things on the floor that can make you trip. What can I do with my stairs? Do not leave any items on the stairs. Make sure that there are handrails on both sides of the stairs and use them. Fix handrails that are broken or loose. Make sure that handrails are as long as the stairways. Check any carpeting to make sure that it is firmly attached to the stairs. Fix any carpet that is loose or worn. Avoid having throw rugs at the top or bottom of the stairs. If you do have throw rugs, attach them to the floor with carpet tape. Make sure that you have a light switch at the top of the stairs and the bottom of the stairs. If you do not have them, ask someone to add them for you. What else can I do to help prevent falls? Wear shoes that: Do not have high heels. Have rubber bottoms. Are comfortable and fit you well. Are closed at the toe. Do not  wear sandals. If you use a stepladder: Make sure that it is fully opened. Do not climb a closed stepladder. Make sure that both sides of the stepladder are locked into place. Ask someone to hold it for you, if possible. Clearly mark and make sure that you can see: Any grab bars or handrails. First and last steps. Where the edge of each step is. Use tools that help you move around (mobility aids) if they are needed. These include: Canes. Walkers. Scooters. Crutches. Turn on the lights when you go into a dark area. Replace any light bulbs as soon as they burn out. Set up your furniture so you have a clear path. Avoid moving your furniture around. If any of your floors are uneven, fix them. If there are any pets around you, be aware of where they are. Review your medicines with your doctor. Some medicines can make you feel dizzy. This can increase your chance of falling. Ask your doctor what other things that you can do to help prevent falls. This information is not intended to replace advice given to you by your health care provider. Make sure you discuss any questions you have with your health care provider. Document Released: 03/20/2009 Document Revised: 10/30/2015 Document Reviewed: 06/28/2014 Elsevier Interactive Patient Education  2017 [employer].

## 2022-12-13 NOTE — Progress Notes (Addendum)
Subjective:   Susan Stanley is a 72 y.o. female who presents for Medicare Annual (Subsequent) preventive examination.  Visit Complete: Virtual  I connected with  Susan Stanley on 12/13/22 by a audio enabled telemedicine application and verified that I am speaking with the correct person using two identifiers.  Patient Location: Home  Provider Location: Office/Clinic  I discussed the limitations of evaluation and management by telemedicine. The patient expressed understanding and agreed to proceed.   Review of Systems     Cardiac Risk Factors include: advanced age (>69men, >73 women);hypertension     Objective:    Today's Vitals   12/13/22 0926  Weight: 138 lb (62.6 kg)  Height: 5\' 5"  (1.651 m)   Body mass index is 22.96 kg/m.     12/13/2022    9:47 AM 12/04/2022    8:55 AM 02/08/2022    3:13 PM 12/03/2021    9:14 AM 10/08/2019    8:10 AM 08/15/2017    8:51 AM  Advanced Directives  Does Patient Have a Medical Advance Directive? Yes Yes No No No No  Type of Estate agent of [place];Living will Healthcare Power of [place];Living will      Copy of Healthcare Power of Attorney in Chart? No - copy requested       Would patient like information on creating a medical advance directive?    No - Patient declined No - Patient declined No - Patient declined    Current Medications (verified) Outpatient Encounter Medications as of 12/13/2022  Medication Sig   amLODipine (NORVASC) 2.5 MG tablet Take 1 tablet (2.5 mg total) by mouth daily.   ascorbic acid (VITAMIN C) 500 MG tablet Take 500 mg by mouth daily.   Calcium 600-5 MG-MCG TABS Take 1 tablet by mouth daily.   Cholecalciferol 50 MCG (2000 UT) TABS Take 1 tablet by mouth daily.   Cyanocobalamin (VITAMIN B 12 PO) Take by mouth.   Magnesium 200 MG TABS Take by mouth. Take one - two daily   Omega-3 Fatty Acids (FISH OIL) 1000 MG CAPS Take by mouth.   omeprazole (PRILOSEC) 20 MG capsule TAKE 1 CAPSULE BY MOUTH EVERYDAY  AT BEDTIME   No facility-administered encounter medications on file as of 12/13/2022.    Allergies (verified) Penicillin g   History: Past Medical History:  Diagnosis Date   Cancer (HCC)    atypical melanoma   Celiac disease    x20 years   Chicken pox    Hypertension    Past Surgical History:  Procedure Laterality Date   APPENDECTOMY     COLONOSCOPY WITH PROPOFOL N/A 10/08/2019   Procedure: COLONOSCOPY WITH PROPOFOL;  Surgeon: Toney Reil, MD;  Location: [facility] ENDOSCOPY;  Service: Gastroenterology;  Laterality: N/A;   ESOPHAGOGASTRODUODENOSCOPY (EGD) WITH PROPOFOL N/A 10/08/2019   Procedure: ESOPHAGOGASTRODUODENOSCOPY (EGD) WITH PROPOFOL;  Surgeon: Toney Reil, MD;  Location: [facility] ENDOSCOPY;  Service: Gastroenterology;  Laterality: N/A;   melanoma removal  2011   top of head   melanoma removal from back  08/2022   MOHS SURGERY Left    Face   Family History  Problem Relation Age of Onset   Alcohol abuse Mother    COPD Mother    Alcohol abuse Father    Cancer Father        lung   Hypertension Father    Alcohol abuse Sister    Diabetes Maternal Grandfather    Arthritis Paternal Grandmother    Cancer Paternal Grandfather  Colon   Breast cancer Neg Hx    Social History   Socioeconomic History   Marital status: Divorced    Spouse name: Not on file   Number of children: Not on file   Years of education: Not on file   Highest education level: Not on file  Occupational History   Not on file  Tobacco Use   Smoking status: Never   Smokeless tobacco: Never  Vaping Use   Vaping Use: Never used  Substance and Sexual Activity   Alcohol use: No   Drug use: No   Sexual activity: Not Currently    Birth control/protection: Post-menopausal  Other Topics Concern   Not on file  Social History Narrative   Daughter is Susan Stanley      From [place]            Social Determinants of Health   Financial Resource Strain: Low Risk  (12/13/2022)    Overall Financial Resource Strain (CARDIA)    Difficulty of Paying Living Expenses: Not hard at all  Food Insecurity: No Food Insecurity (12/13/2022)   Hunger Vital Sign    Worried About Running Out of Food in the Last Year: Never true    Ran Out of Food in the Last Year: Never true  Transportation Needs: No Transportation Needs (12/13/2022)   PRAPARE - Administrator, Civil Service (Medical): No    Lack of Transportation (Non-Medical): No  Physical Activity: Insufficiently Active (12/13/2022)   Exercise Vital Sign    Days of Exercise per Week: 4 days    Minutes of Exercise per Session: 20 min  Stress: No Stress Concern Present (12/13/2022)   [employer] of Occupational Health - Occupational Stress Questionnaire    Feeling of Stress : Not at all  Social Connections: Moderately Isolated (12/13/2022)   Social Connection and Isolation Panel [NHANES]    Frequency of Communication with Friends and Family: More than three times a week    Frequency of Social Gatherings with Friends and Family: More than three times a week    Attends Religious Services: Never    Database administrator or Organizations: Yes    Attends Engineer, structural: More than 4 times per year    Marital Status: Divorced    Tobacco Counseling Counseling given: Not Answered   Clinical Intake:  Pre-visit preparation completed: Yes  Pain : No/denies pain     BMI - recorded: 22.96 Nutritional Status: BMI of 19-24  Normal Nutritional Risks: None Diabetes: No  How often do you need to have someone help you when you read instructions, pamphlets, or other written materials from your doctor or pharmacy?: 1 - Never  Interpreter Needed?: No  Information entered by :: R. Tanesha Arambula LPN   Activities of Daily Living    12/13/2022    9:29 AM 12/12/2022    1:47 PM  In your present state of health, do you have any difficulty performing the following activities:  Hearing? 1 0  Comment thinking about getting  hearing aids   Vision? 0 0  Difficulty concentrating or making decisions? 0 0  Walking or climbing stairs? 0 0  Dressing or bathing? 0 0  Doing errands, shopping? 0 0  Preparing Food and eating ? N N  Using the Toilet? N N  In the past six months, have you accidently leaked urine? N N  Do you have problems with loss of bowel control? Malvin Johns  Comment rarely   Managing  your Medications? N N  Managing your Finances? N N  Housekeeping or managing your Housekeeping? N N    Patient Care Team: Allegra Grana, FNP as PCP - General (Family Medicine)  Indicate any recent Medical Services you may have received from other than [facility] providers in the past year (date may be approximate).     Assessment:   This is a routine wellness examination for Susan Stanley.  Hearing/Vision screen Hearing Screening - Comments:: Thinking about getting hearing aids Vision Screening - Comments:: No issues  Dietary issues and exercise activities discussed:     Goals Addressed             This Visit's Progress    Patient Stated       Patient wants to work on her diet       Depression Screen    12/13/2022    9:40 AM 07/07/2022    3:47 PM 06/14/2022    1:29 PM 05/14/2022    1:50 PM 12/03/2021    9:18 AM 03/31/2021   10:04 AM 01/26/2019    4:30 PM  PHQ 2/9 Scores  PHQ - 2 Score 0 0  0 0 0 0  PHQ- 9 Score 0      0  Exception Documentation   Patient refusal        Fall Risk    12/13/2022    9:49 AM 12/12/2022    1:47 PM 07/07/2022    3:46 PM 06/14/2022    1:29 PM 05/14/2022    1:50 PM  Fall Risk   Falls in the past year? 0 0 0 0 0  Number falls in past yr: 0  0 0 0  Injury with Fall? 0  0 0 0  Risk for fall due to : No Fall Risks  No Fall Risks No Fall Risks No Fall Risks  Follow up Falls prevention discussed;Falls evaluation completed  Falls evaluation completed Falls evaluation completed Falls evaluation completed    MEDICARE RISK AT HOME:  Medicare Risk at Home - 12/13/22 0949     Any stairs in  or around the home? No    If so, are there any without handrails? No    Home free of loose throw rugs in walkways, pet beds, electrical cords, etc? Yes    Adequate lighting in your home to reduce risk of falls? Yes    Life alert? No    Use of a cane, walker or w/c? No    Grab bars in the bathroom? No    Shower chair or bench in shower? Yes    Elevated toilet seat or a handicapped toilet? No              Cognitive Function:    08/15/2017    9:00 AM  MMSE - Mini Mental State Exam  Orientation to time 5  Orientation to Place 5  Registration 3  Attention/ Calculation 5  Recall 3  Language- name 2 objects 2  Language- repeat 1  Language- follow 3 step command 3  Language- read & follow direction 1  Write a sentence 1  Copy design 1  Total score 30        12/13/2022    9:51 AM  6CIT Screen  What Year? 0 points  What month? 0 points  What time? 0 points  Count back from 20 0 points  Months in reverse 0 points  Repeat phrase 0 points  Total Score 0 points    Immunizations  Immunization History  Administered Date(s) Administered   Influenza-Unspecified 01/26/2019   Pneumococcal Conjugate-13 05/07/2016   Pneumococcal Polysaccharide-23 05/10/2017   Tdap 03/08/2016   Zoster Recombinant(Shingrix) 11/05/2016, 06/30/2017    TDAP status: Up to date  Flu Vaccine status: Declined, Education has been provided regarding the importance of this vaccine but patient still declined. Advised may receive this vaccine at local pharmacy or Health Dept. Aware to provide a copy of the vaccination record if obtained from local pharmacy or Health Dept. Verbalized acceptance and understanding.  Pneumococcal vaccine status: Up to date  Covid-19 vaccine status: Declined, Education has been provided regarding the importance of this vaccine but patient still declined. Advised may receive this vaccine at local pharmacy or Health Dept.or vaccine clinic. Aware to provide a copy of the vaccination  record if obtained from local pharmacy or Health Dept. Verbalized acceptance and understanding.  Qualifies for Shingles Vaccine? Yes   Zostavax completed No   Shingrix Completed?: Yes  Screening Tests Health Maintenance  Topic Date Due   Medicare Annual Wellness (AWV)  12/04/2022   INFLUENZA VACCINE  01/06/2023   Colonoscopy  10/07/2024   DTaP/Tdap/Td (2 - Td or Tdap) 03/08/2026   Pneumonia Vaccine 69+ Years old  Completed   DEXA SCAN  Completed   Hepatitis C Screening  Completed   Zoster Vaccines- Shingrix  Completed   HPV VACCINES  Aged Out   MAMMOGRAM  Discontinued   COVID-19 Vaccine  Discontinued    Health Maintenance  Health Maintenance Due  Topic Date Due   Medicare Annual Wellness (AWV)  12/04/2022    Colorectal cancer screening: Type of screening: Colonoscopy. Completed 5/21. Repeat every 5 years  Mammogram status: Completed 9/20. Repeat every year year. Patient not interested in mammogram   Bone Density status: Completed 3/23. Results reflect: Bone density results: OSTEOPOROSIS. Repeat every 2 years.  Lung Cancer Screening: (Low Dose CT Chest recommended if Age 97-80 years, 20 pack-year currently smoking OR have quit w/in 15years.) does not qualify.     Additional Screening:  Hepatitis C Screening: does qualify; Completed 5/19  Vision Screening: Recommended annual ophthalmology exams for early detection of glaucoma and other disorders of the eye. Is the patient up to date with their annual eye exam?  Yes  Who is the provider or what is the name of the office in which the patient attends annual eye exams? [facility] If pt is not established with a provider, would they like to be referred to a provider to establish care? No .   Dental Screening: Recommended annual dental exams for proper oral hygiene    Community Resource Referral / Chronic Care Management: CRR required this visit?  No   CCM required this visit?  No     Plan:     I have  personally reviewed and noted the following in the patient's chart:   Medical and social history Use of alcohol, tobacco or illicit drugs  Current medications and supplements including opioid prescriptions. Patient is not currently taking opioid prescriptions. Functional ability and status Nutritional status Physical activity Advanced directives List of other physicians Hospitalizations, surgeries, and ER visits in previous 12 months Vitals Screenings to include cognitive, depression, and falls Referrals and appointments  In addition, I have reviewed and discussed with patient certain preventive protocols, quality metrics, and best practice recommendations. A written personalized care plan for preventive services as well as general preventive health recommendations were provided to patient.     Sydell Axon, LPN  12/13/2022   After Visit Summary: (MyChart) Due to this being a telephonic visit, the after visit summary with patients personalized plan was offered to patient via MyChart   Nurse Notes: None

## 2022-12-14 DIAGNOSIS — H60542 Acute eczematoid otitis externa, left ear: Secondary | ICD-10-CM | POA: Diagnosis not present

## 2022-12-14 DIAGNOSIS — H6123 Impacted cerumen, bilateral: Secondary | ICD-10-CM | POA: Diagnosis not present

## 2023-02-08 ENCOUNTER — Emergency Department
Admission: EM | Admit: 2023-02-08 | Discharge: 2023-02-08 | Disposition: A | Discharge status: Home / Self Care | Payer: Medicare Other | Attending: Emergency Medicine | Admitting: Emergency Medicine

## 2023-02-08 ENCOUNTER — Other Ambulatory Visit: Payer: Self-pay

## 2023-02-08 ENCOUNTER — Encounter: Payer: Self-pay | Admitting: Emergency Medicine

## 2023-02-08 ENCOUNTER — Emergency Department: Payer: Medicare Other

## 2023-02-08 DIAGNOSIS — R1032 Left lower quadrant pain: Secondary | ICD-10-CM | POA: Diagnosis not present

## 2023-02-08 DIAGNOSIS — N8110 Cystocele, unspecified: Secondary | ICD-10-CM | POA: Diagnosis not present

## 2023-02-08 DIAGNOSIS — R1030 Lower abdominal pain, unspecified: Secondary | ICD-10-CM | POA: Diagnosis not present

## 2023-02-08 DIAGNOSIS — K449 Diaphragmatic hernia without obstruction or gangrene: Secondary | ICD-10-CM | POA: Diagnosis not present

## 2023-02-08 LAB — CBC
HCT: 40.6 % (ref 36.0–46.0)
Hemoglobin: 13.7 g/dL (ref 12.0–15.0)
MCH: 30.1 pg (ref 26.0–34.0)
MCHC: 33.7 g/dL (ref 30.0–36.0)
MCV: 89.2 fL (ref 80.0–100.0)
Platelets: 325 10*3/uL (ref 150–400)
RBC: 4.55 MIL/uL (ref 3.87–5.11)
RDW: 12.8 % (ref 11.5–15.5)
WBC: 5.2 10*3/uL (ref 4.0–10.5)
nRBC: 0 % (ref 0.0–0.2)

## 2023-02-08 LAB — COMPREHENSIVE METABOLIC PANEL
ALT: 15 U/L (ref 0–44)
AST: 18 U/L (ref 15–41)
Albumin: 4.4 g/dL (ref 3.5–5.0)
Alkaline Phosphatase: 80 U/L (ref 38–126)
Anion gap: 10 (ref 5–15)
BUN: 10 mg/dL (ref 8–23)
CO2: 24 mmol/L (ref 22–32)
Calcium: 9.1 mg/dL (ref 8.9–10.3)
Chloride: 103 mmol/L (ref 98–111)
Creatinine, Ser: 0.59 mg/dL (ref 0.44–1.00)
GFR, Estimated: >60 mL/min (ref >60)
Glucose, Bld: 110 mg/dL — ABNORMAL HIGH (ref 70–99)
Potassium: 3.7 mmol/L (ref 3.5–5.1)
Sodium: 137 mmol/L (ref 135–145)
Total Bilirubin: 0.4 mg/dL (ref 0.3–1.2)
Total Protein: 7.8 g/dL (ref 6.5–8.1)

## 2023-02-08 LAB — URINALYSIS, ROUTINE W REFLEX MICROSCOPIC
Bacteria, UA: NONE SEEN
Bilirubin Urine: NEGATIVE
Glucose, UA: NEGATIVE mg/dL
Hgb urine dipstick: NEGATIVE
Ketones, ur: NEGATIVE mg/dL
Leukocytes,Ua: NEGATIVE
Nitrite: NEGATIVE
Protein, ur: NEGATIVE mg/dL
Specific Gravity, Urine: 1.01 (ref 1.005–1.030)
pH: 7 (ref 5.0–8.0)

## 2023-02-08 LAB — LIPASE, BLOOD: Lipase: 29 U/L (ref 11–51)

## 2023-02-08 MED ORDER — DICYCLOMINE HCL 10 MG PO CAPS: 10.0000 mg | ORAL_CAPSULE | Freq: Three times a day (TID) | ORAL | 0 refills | Status: DC

## 2023-02-08 MED ORDER — IOHEXOL 300 MG/ML  SOLN
100.0000 mL | Freq: Once | INTRAMUSCULAR | Status: AC | PRN
  Administered 2023-02-08: 100 mL via INTRAVENOUS

## 2023-02-08 NOTE — ED Triage Notes (Signed)
Patient to ED via POV for abd pain. Ongoing x8 days. Pt states smelly and soft BM- but not diarrhea.

## 2023-02-08 NOTE — ED Notes (Signed)
EDP at bedside  

## 2023-02-08 NOTE — ED Notes (Signed)
Pt to ED for cramping pain to periumbilical area of abdomen since 8 days. Hx celiac. Pt in NAD, skin dry. Ambulatory. Denies urinary symptoms.

## 2023-02-08 NOTE — ED Provider Notes (Signed)
[facility] Provider Note    Event Date/Time   First MD Initiated Contact with Patient 02/08/23 0840     (approximate)   History   Abdominal Pain   HPI  Susan Stanley is a 71 y.o. female with a history of celiac disease who presents with complaints of lower abdominal pain.  Patient describes 8 days of lower abdominal pain, more central than lateral, denies dysuria.  No vomiting.  No fevers.  History of appendectomy in the past otherwise no abdominal surgeries     Physical Exam   Triage Vital Signs: ED Triage Vitals [02/08/23 0847]  Encounter Vitals Group     BP (!) 187/96     Systolic BP Percentile      Diastolic BP Percentile      Pulse Rate (!) 105     Resp 18     Temp 98 F (36.7 C)     Temp Source Oral     SpO2 98 %     Weight 62.1 kg (137 lb)     Height 1.651 m (5\' 5" )     Head Circumference      Peak Flow      Pain Score 5     Pain Loc      Pain Education      Exclude from Growth Chart     Most recent vital signs: Vitals:   02/08/23 0847  BP: (!) 187/96  Pulse: (!) 105  Resp: 18  Temp: 98 F (36.7 C)  SpO2: 98%     General: Awake, no distress.  CV:  Good peripheral perfusion.  Resp:  Normal effort.  Abd:  No distention.  Mild tenderness in the lower abdomen, no CVA tenderness. Other:     ED Results / Procedures / Treatments   Labs (all labs ordered are listed, but only abnormal results are displayed) Labs Reviewed  COMPREHENSIVE METABOLIC PANEL - Abnormal; Notable for the following components:      Result Value   Glucose, Bld 110 (*)    All other components within normal limits  LIPASE, BLOOD  CBC  URINALYSIS, ROUTINE W REFLEX MICROSCOPIC     EKG     RADIOLOGY CT abdomen pelvis viewed interpreted by me, no ureterolithiasis    PROCEDURES:  Critical Care performed:   Procedures   MEDICATIONS ORDERED IN ED: Medications  iohexol (OMNIPAQUE) 300 MG/ML solution 100 mL (100 mLs Intravenous Contrast  Given 02/08/23 0940)     IMPRESSION / MDM / ASSESSMENT AND PLAN / ED COURSE  I reviewed the triage vital signs and the nursing notes. Patient's presentation is most consistent with acute presentation with potential threat to life or bodily function.  Patient presents with lower abdominal pain as detailed above, differential includes diverticulitis, UTI, ureterolithiasis, will obtain labs, urinalysis, CT abdomen pelvis and reevaluate.  Lab work is reassuring, CT abdomen pelvis performed in without acute findings.  Patient is reassured, will trial Bentyl, recommend outpatient follow-up, return precautions, patient agrees with this plan.      FINAL CLINICAL IMPRESSION(S) / ED DIAGNOSES   Final diagnoses:  Lower abdominal pain     Rx / DC Orders   ED Discharge Orders          Ordered    dicyclomine (BENTYL) 10 MG capsule  3 times daily before meals & bedtime        02/08/23 1102             Note:  This  document was prepared using Conservation officer, historic buildings and may include unintentional dictation errors.   Jene Every, MD 02/08/23 [phone]

## 2023-02-28 ENCOUNTER — Telehealth: Payer: Self-pay

## 2023-02-28 NOTE — Telephone Encounter (Signed)
Transition Care Management Unsuccessful Follow-up Telephone Call  Date of discharge and from where:  [facility] 9/3  Attempts:  1st Attempt  Reason for unsuccessful TCM follow-up call:  No answer/busy

## 2023-03-01 ENCOUNTER — Telehealth: Payer: Self-pay

## 2023-03-01 DIAGNOSIS — Z85828 Personal history of other malignant neoplasm of skin: Secondary | ICD-10-CM | POA: Diagnosis not present

## 2023-03-01 NOTE — Telephone Encounter (Signed)
Transition Care Management Unsuccessful Follow-up Telephone Call  Date of discharge and from where:  9/3 Attempts:  2nd Attempt  Reason for unsuccessful TCM follow-up call:  No answer/busy   Susan Stanley  [facility], [facility] Guide, Phone: [phone] Website: [email or web address]

## 2023-03-08 DIAGNOSIS — C434 Malignant melanoma of scalp and neck: Secondary | ICD-10-CM | POA: Diagnosis not present

## 2023-03-08 DIAGNOSIS — Z85820 Personal history of malignant melanoma of skin: Secondary | ICD-10-CM | POA: Diagnosis not present

## 2023-03-08 DIAGNOSIS — Z08 Encounter for follow-up examination after completed treatment for malignant neoplasm: Secondary | ICD-10-CM | POA: Diagnosis not present

## 2023-03-08 DIAGNOSIS — Z79899 Other long term (current) drug therapy: Secondary | ICD-10-CM | POA: Diagnosis not present

## 2023-03-08 DIAGNOSIS — Z801 Family history of malignant neoplasm of trachea, bronchus and lung: Secondary | ICD-10-CM | POA: Diagnosis not present

## 2023-03-08 DIAGNOSIS — Z800 Family history of malignant neoplasm of digestive organs: Secondary | ICD-10-CM | POA: Diagnosis not present

## 2023-05-18 DIAGNOSIS — H6123 Impacted cerumen, bilateral: Secondary | ICD-10-CM | POA: Diagnosis not present

## 2023-05-18 DIAGNOSIS — H903 Sensorineural hearing loss, bilateral: Secondary | ICD-10-CM | POA: Diagnosis not present

## 2023-06-06 ENCOUNTER — Encounter: Payer: Self-pay | Admitting: *Deleted

## 2023-06-06 ENCOUNTER — Ambulatory Visit
Admission: EM | Admit: 2023-06-06 | Discharge: 2023-06-06 | Disposition: A | Discharge status: Home / Self Care | Payer: Medicare Other | Attending: Emergency Medicine | Admitting: Emergency Medicine

## 2023-06-06 ENCOUNTER — Other Ambulatory Visit: Payer: Self-pay

## 2023-06-06 DIAGNOSIS — J069 Acute upper respiratory infection, unspecified: Secondary | ICD-10-CM

## 2023-06-06 MED ORDER — AZITHROMYCIN 250 MG PO TABS: 250.0000 mg | ORAL_TABLET | Freq: Every day | ORAL | 0 refills | Status: DC

## 2023-06-06 NOTE — ED Provider Notes (Signed)
Susan Stanley    CSN: [id number] Arrival date & time: 06/06/23  0808      History   Chief Complaint Chief Complaint  Patient presents with   chest cold    HPI Susan Stanley is a 72 y.o. female.   As for evaluation of generalized bodyaches, intermittent generalized headaches, sore throat, nasal and head congestion and a productive cough with clear sputum present for 13 days.  Known sick contact prior.  Tolerating food and liquids.  Has attempted use of over-the-counter medication and increase fluids with minimal improvement.  Endorses wheezing resolved 5 days ago, denies shortness of breath, chest pain or tightness.  Past Medical History:  Diagnosis Date   Cancer (HCC)    atypical melanoma   Celiac disease    x20 years   Chicken pox    Hypertension     Patient Active Problem List   Diagnosis Date Noted   Lung nodule 07/07/2022   Aortic atherosclerosis (HCC) 06/18/2022   Headache 06/15/2022   Barrett's esophagus 10/12/2019   Celiac disease 01/12/2017   Osteoporosis 01/12/2017   HTN (hypertension) 01/12/2017   History of melanoma 01/12/2017   GERD (gastroesophageal reflux disease) 01/12/2017    Past Surgical History:  Procedure Laterality Date   APPENDECTOMY     COLONOSCOPY WITH PROPOFOL N/A 10/08/2019   Procedure: COLONOSCOPY WITH PROPOFOL;  Surgeon: Toney Reil, MD;  Location: [facility] ENDOSCOPY;  Service: Gastroenterology;  Laterality: N/A;   ESOPHAGOGASTRODUODENOSCOPY (EGD) WITH PROPOFOL N/A 10/08/2019   Procedure: ESOPHAGOGASTRODUODENOSCOPY (EGD) WITH PROPOFOL;  Surgeon: Toney Reil, MD;  Location: [facility] ENDOSCOPY;  Service: Gastroenterology;  Laterality: N/A;   melanoma removal  2011   top of head   melanoma removal from back  08/2022   MOHS SURGERY Left    Face    OB History     Gravida  3   Para  2   Term  2   Preterm      AB  1   Living  2      SAB      IAB      Ectopic      Multiple      Live Births                Home Medications    Prior to Admission medications   Medication Sig Start Date End Date Taking? Authorizing Provider  amLODipine (NORVASC) 2.5 MG tablet Take 1 tablet (2.5 mg total) by mouth daily. 05/14/22  Yes Susan, Lyn Records, FNP  azithromycin (ZITHROMAX) 250 MG tablet Take 1 tablet (250 mg total) by mouth daily. Take first 2 tablets together, then 1 every day until finished. 06/06/23  Yes Maizee Reinhold, Elita Boone, NP  Calcium 600-5 MG-MCG TABS Take 1 tablet by mouth daily.   Yes [provider]  Cholecalciferol 50 MCG (2000 UT) TABS Take 1 tablet by mouth daily.   Yes [provider]  Cyanocobalamin (VITAMIN B 12 PO) Take by mouth.   Yes [provider]  Magnesium 200 MG TABS Take by mouth. Take one - two daily   Yes [provider]  Omega-3 Fatty Acids (FISH OIL) 1000 MG CAPS Take by mouth.   Yes [provider]  omeprazole (PRILOSEC) 20 MG capsule TAKE 1 CAPSULE BY MOUTH EVERYDAY AT BEDTIME 11/08/22  Yes [name], [name], MD  ascorbic acid (VITAMIN C) 500 MG tablet Take 500 mg by mouth daily.    [provider]  dicyclomine (BENTYL) 10  MG capsule Take 1 capsule (10 mg total) by mouth 4 (four) times daily -  before meals and at bedtime. 02/08/23   Jene Every, MD    Family History Family History  Problem Relation Age of Onset   Alcohol abuse Mother    COPD Mother    Alcohol abuse Father    Cancer Father        lung   Hypertension Father    Alcohol abuse Sister    Diabetes Maternal Grandfather    Arthritis Paternal Grandmother    Cancer Paternal Grandfather        Colon   Breast cancer Neg Hx     Social History Social History   Tobacco Use   Smoking status: Never   Smokeless tobacco: Never  Vaping Use   Vaping status: Never Used  Substance Use Topics   Alcohol use: No   Drug use: No     Allergies   Penicillin g   Review of Systems Review of Systems   Physical Exam Triage Vital Signs ED Triage  Vitals  Encounter Vitals Group     BP 06/06/23 0836 (!) 159/90     Systolic BP Percentile --      Diastolic BP Percentile --      Pulse Rate 06/06/23 0836 83     Resp 06/06/23 0836 20     Temp 06/06/23 0836 98.7 F (37.1 C)     Temp src --      SpO2 06/06/23 0836 95 %     Weight --      Height --      Head Circumference --      Peak Flow --      Pain Score 06/06/23 0834 3     Pain Loc --      Pain Education --      Exclude from Growth Chart --    No data found.  Updated Vital Signs BP (!) 159/90   Pulse 83   Temp 98.7 F (37.1 C)   Resp 20   SpO2 95%   Visual Acuity Right Eye Distance:   Left Eye Distance:   Bilateral Distance:    Right Eye Near:   Left Eye Near:    Bilateral Near:     Physical Exam Constitutional:      Appearance: Normal appearance.  HENT:     Head: Normocephalic.     Right Ear: Tympanic membrane, ear canal and external ear normal.     Left Ear: Tympanic membrane, ear canal and external ear normal.     Nose: Congestion present. No rhinorrhea.     Mouth/Throat:     Pharynx: No oropharyngeal exudate or posterior oropharyngeal erythema.  Eyes:     Extraocular Movements: Extraocular movements intact.  Cardiovascular:     Rate and Rhythm: Normal rate and regular rhythm.     Pulses: Normal pulses.     Heart sounds: Normal heart sounds.  Pulmonary:     Effort: Pulmonary effort is normal.     Breath sounds: Normal breath sounds.  Musculoskeletal:     Cervical back: Normal range of motion and neck supple.  Neurological:     Mental Status: She is alert and oriented to person, place, and time. Mental status is at baseline.      UC Treatments / Results  Labs (all labs ordered are listed, but only abnormal results are displayed) Labs Reviewed - No data to display  EKG   Radiology No results  found.  Procedures Procedures (including critical care time)  Medications Ordered in UC Medications - No data to display  Initial Impression  / Assessment and Plan / UC Course  I have reviewed the triage vital signs and the nursing notes.  Pertinent labs & imaging results that were available during my care of the patient were reviewed by me and considered in my medical decision making (see chart for details).  Acute URI  Patient is in no signs of distress nor toxic appearing.  Vital signs are stable.  Low suspicion for pneumonia, pneumothorax or bronchitis and therefore will defer imaging.  Symptoms present for 13 days without signs of resolution, prescribed azithromycin, declined prescription for cough medicine.May use additional over-the-counter medications as needed for supportive care.  May follow-up with urgent care as needed if symptoms persist or worsen.   Final Clinical Impressions(s) / UC Diagnoses   Final diagnoses:  Acute URI     Discharge Instructions      Begin azithromycin to provide coverage for bacteria which may be causing symptoms to prolong    You can take Tylenol and/or Ibuprofen as needed for fever reduction and pain relief.   For cough: honey 1/2 to 1 teaspoon (you can dilute the honey in water or another fluid).  You can also use guaifenesin and dextromethorphan for cough. You can use a humidifier for chest congestion and cough.  If you don't have a humidifier, you can sit in the bathroom with the hot shower running.      For sore throat: try warm salt water gargles, cepacol lozenges, throat spray, warm tea or water with lemon/honey, popsicles or ice, or OTC cold relief medicine for throat discomfort.   For congestion: take a daily anti-histamine like Zyrtec, Claritin, and a oral decongestant, such as pseudoephedrine.  You can also use Flonase 1-2 sprays in each nostril daily.   It is important to stay hydrated: drink plenty of fluids (water, gatorade/powerade/pedialyte, juices, or teas) to keep your throat moisturized and help further relieve irritation/discomfort.    ED Prescriptions      Medication Sig Dispense Auth. Provider   azithromycin (ZITHROMAX) 250 MG tablet Take 1 tablet (250 mg total) by mouth daily. Take first 2 tablets together, then 1 every day until finished. 6 tablet Valinda Hoar, NP      PDMP not reviewed this encounter.   Valinda Hoar, [place] 06/06/23 [phone]

## 2023-06-06 NOTE — ED Triage Notes (Signed)
Pt reports she has a chest cold for 13 days. Pt has cough.

## 2023-06-06 NOTE — Discharge Instructions (Signed)
Begin azithromycin to provide coverage for bacteria which may be causing symptoms to prolong    You can take Tylenol and/or Ibuprofen as needed for fever reduction and pain relief.   For cough: honey 1/2 to 1 teaspoon (you can dilute the honey in water or another fluid).  You can also use guaifenesin and dextromethorphan for cough. You can use a humidifier for chest congestion and cough.  If you don't have a humidifier, you can sit in the bathroom with the hot shower running.      For sore throat: try warm salt water gargles, cepacol lozenges, throat spray, warm tea or water with lemon/honey, popsicles or ice, or OTC cold relief medicine for throat discomfort.   For congestion: take a daily anti-histamine like Zyrtec, Claritin, and a oral decongestant, such as pseudoephedrine.  You can also use Flonase 1-2 sprays in each nostril daily.   It is important to stay hydrated: drink plenty of fluids (water, gatorade/powerade/pedialyte, juices, or teas) to keep your throat moisturized and help further relieve irritation/discomfort.

## 2023-06-14 DIAGNOSIS — Z85828 Personal history of other malignant neoplasm of skin: Secondary | ICD-10-CM | POA: Diagnosis not present

## 2023-06-15 DIAGNOSIS — D2262 Melanocytic nevi of left upper limb, including shoulder: Secondary | ICD-10-CM | POA: Diagnosis not present

## 2023-06-15 DIAGNOSIS — Z872 Personal history of diseases of the skin and subcutaneous tissue: Secondary | ICD-10-CM | POA: Diagnosis not present

## 2023-06-15 DIAGNOSIS — Z85828 Personal history of other malignant neoplasm of skin: Secondary | ICD-10-CM | POA: Diagnosis not present

## 2023-06-15 DIAGNOSIS — L821 Other seborrheic keratosis: Secondary | ICD-10-CM | POA: Diagnosis not present

## 2023-06-15 DIAGNOSIS — D2271 Melanocytic nevi of right lower limb, including hip: Secondary | ICD-10-CM | POA: Diagnosis not present

## 2023-06-15 DIAGNOSIS — D2272 Melanocytic nevi of left lower limb, including hip: Secondary | ICD-10-CM | POA: Diagnosis not present

## 2023-06-15 DIAGNOSIS — C7989 Secondary malignant neoplasm of other specified sites: Secondary | ICD-10-CM | POA: Diagnosis not present

## 2023-06-15 DIAGNOSIS — D485 Neoplasm of uncertain behavior of skin: Secondary | ICD-10-CM | POA: Diagnosis not present

## 2023-06-15 DIAGNOSIS — C434 Malignant melanoma of scalp and neck: Secondary | ICD-10-CM | POA: Diagnosis not present

## 2023-06-15 DIAGNOSIS — Z85820 Personal history of malignant melanoma of skin: Secondary | ICD-10-CM | POA: Diagnosis not present

## 2023-06-15 DIAGNOSIS — D2261 Melanocytic nevi of right upper limb, including shoulder: Secondary | ICD-10-CM | POA: Diagnosis not present

## 2023-06-15 DIAGNOSIS — D225 Melanocytic nevi of trunk: Secondary | ICD-10-CM | POA: Diagnosis not present

## 2023-06-30 ENCOUNTER — Ambulatory Visit: Payer: Medicare Other | Admitting: Internal Medicine

## 2023-06-30 ENCOUNTER — Ambulatory Visit: Payer: Medicare Other

## 2023-06-30 ENCOUNTER — Encounter: Payer: Self-pay | Admitting: Internal Medicine

## 2023-06-30 ENCOUNTER — Ambulatory Visit: Payer: Self-pay | Admitting: Family

## 2023-06-30 VITALS — BP 142/90 | HR 118 | Ht 65.0 in | Wt 141.0 lb

## 2023-06-30 DIAGNOSIS — R1084 Generalized abdominal pain: Secondary | ICD-10-CM | POA: Diagnosis not present

## 2023-06-30 DIAGNOSIS — K5900 Constipation, unspecified: Secondary | ICD-10-CM | POA: Diagnosis not present

## 2023-06-30 DIAGNOSIS — R1030 Lower abdominal pain, unspecified: Secondary | ICD-10-CM | POA: Diagnosis not present

## 2023-06-30 DIAGNOSIS — G4452 New daily persistent headache (NDPH): Secondary | ICD-10-CM

## 2023-06-30 DIAGNOSIS — R519 Headache, unspecified: Secondary | ICD-10-CM

## 2023-06-30 DIAGNOSIS — R109 Unspecified abdominal pain: Secondary | ICD-10-CM | POA: Insufficient documentation

## 2023-06-30 LAB — URINALYSIS, ROUTINE W REFLEX MICROSCOPIC
Bilirubin Urine: NEGATIVE
Hgb urine dipstick: NEGATIVE
Ketones, ur: NEGATIVE
Leukocytes,Ua: NEGATIVE
Nitrite: NEGATIVE
RBC / HPF: NONE SEEN (ref 0–?)
Specific Gravity, Urine: <=1.005 — AB (ref 1.000–1.030)
Total Protein, Urine: NEGATIVE
Urine Glucose: NEGATIVE
Urobilinogen, UA: 0.2 (ref 0.0–1.0)
pH: 7 (ref 5.0–8.0)

## 2023-06-30 LAB — COMPREHENSIVE METABOLIC PANEL
ALT: 14 U/L (ref 0–35)
AST: 16 U/L (ref 0–37)
Albumin: 4.7 g/dL (ref 3.5–5.2)
Alkaline Phosphatase: 95 U/L (ref 39–117)
BUN: 8 mg/dL (ref 6–23)
CO2: 29 meq/L (ref 19–32)
Calcium: 9.7 mg/dL (ref 8.4–10.5)
Chloride: 101 meq/L (ref 96–112)
Creatinine, Ser: 0.62 mg/dL (ref 0.40–1.20)
GFR: 87.36 mL/min (ref >60.00)
Glucose, Bld: 91 mg/dL (ref 70–99)
Potassium: 4 meq/L (ref 3.5–5.1)
Sodium: 138 meq/L (ref 135–145)
Total Bilirubin: 0.3 mg/dL (ref 0.2–1.2)
Total Protein: 7.5 g/dL (ref 6.0–8.3)

## 2023-06-30 LAB — CBC WITH DIFFERENTIAL/PLATELET
Basophils Absolute: 0 10*3/uL (ref 0.0–0.1)
Basophils Relative: 0.4 % (ref 0.0–3.0)
Eosinophils Absolute: 0.2 10*3/uL (ref 0.0–0.7)
Eosinophils Relative: 3.3 % (ref 0.0–5.0)
HCT: 42.3 % (ref 36.0–46.0)
Hemoglobin: 14 g/dL (ref 12.0–15.0)
Lymphocytes Relative: 31.6 % (ref 12.0–46.0)
Lymphs Abs: 1.6 10*3/uL (ref 0.7–4.0)
MCHC: 33.1 g/dL (ref 30.0–36.0)
MCV: 90.4 fL (ref 78.0–100.0)
Monocytes Absolute: 0.4 10*3/uL (ref 0.1–1.0)
Monocytes Relative: 9 % (ref 3.0–12.0)
Neutro Abs: 2.8 10*3/uL (ref 1.4–7.7)
Neutrophils Relative %: 55.7 % (ref 43.0–77.0)
Platelets: 315 10*3/uL (ref 150.0–400.0)
RBC: 4.68 Mil/uL (ref 3.87–5.11)
RDW: 13.8 % (ref 11.5–15.5)
WBC: 4.9 10*3/uL (ref 4.0–10.5)

## 2023-06-30 LAB — C-REACTIVE PROTEIN: CRP: <1 mg/dL (ref 0.5–20.0)

## 2023-06-30 LAB — LIPASE: Lipase: 15 U/L (ref 11.0–59.0)

## 2023-06-30 LAB — SEDIMENTATION RATE: Sed Rate: 34 mm/h — ABNORMAL HIGH (ref 0–30)

## 2023-06-30 MED ORDER — ONDANSETRON 4 MG PO TBDP: 4.0000 mg | ORAL_TABLET | Freq: Three times a day (TID) | ORAL | 0 refills | Status: DC | PRN

## 2023-06-30 NOTE — Telephone Encounter (Signed)
Copied from CRM [id number]. Topic: Clinical - Red Word Triage >> Jun 30, 2023 10:20 AM Isabell A wrote: [facility] that prompted transfer to Nurse Triage: Severe pain - headache & abdominal pain   Chief Complaint: Abdominal cramping/pain off and on for a week & headache for 8 days Symptoms: Pain Frequency: x 1 week Pertinent Negatives: Patient denies difficulty sleeping, dehydration, back pain, diarrhea, fever, urination pain, vomiting, stiff neck, eye pain, sore throat, cold symptoms, urnary complaints, blood in stools Disposition: [] ED /[] Urgent Care (no appt availability in office) / [x] Appointment(In office/virtual)/ []  [facility]/ [] Home Care/ [] Refused Recommended Disposition /[] [facility]/ []  Follow-up with PCP Additional Notes: Patient called and advised that for a week she has had a headache and abdominal pain.  Patient states that she has had headaches for a very long time and she has seen her provider for it.  Patient states she has had some soft but formed stools most of the week. Patient has a history of celiac but she is careful about what she eats. Patient denies difficulty sleeping, dehydration, back pain, diarrhea, fever, urination pain, vomiting, stiff neck, eye pain, sore throat, cold symptoms, urinary complaints, or blood in stool.  Patient states that her head has been hurting for about a week and a day but she is more concerned about her abdomen hurting. She states that she has been hydrating well. She states that she has a stool sample she is bringing with her in case the provider would like to test it.  Appointment made for today 06/30/2023 at 12pm with Duncan Dull.  Reason for Disposition  [1] MODERATE pain (e.g., interferes with normal activities) AND [2] pain comes and goes (cramps) AND [3] present > 24 hours  (Exception: Pain with Vomiting or Diarrhea - see that Guideline.)  [1] MILD-MODERATE headache AND [2] present > 72 hours  Answer Assessment -  Initial Assessment Questions 1. LOCATION: "Where does it hurt?"      Right above waist 2. RADIATION: "Does the pain shoot anywhere else?" (e.g., chest, back)     No 3. ONSET: "When did the pain begin?" (e.g., minutes, hours or days ago)      1 week ago 4. SUDDEN: "Gradual or sudden onset?"     Sudden 5. PATTERN "Does the pain come and go, or is it constant?"    - If it comes and goes: "How long does it last?" "Do you have pain now?"     (Note: Comes and goes means the pain is intermittent. It goes away completely between bouts.)    - If constant: "Is it getting better, staying the same, or getting worse?"      (Note: Constant means the pain never goes away completely; most serious pain is constant and gets worse.)      Cramping comes and goes 6. SEVERITY: "How bad is the pain?"  (e.g., Scale 1-10; mild, moderate, or severe)    - MILD (1-3): Doesn't interfere with normal activities, abdomen soft and not tender to touch.     - MODERATE (4-7): Interferes with normal activities or awakens from sleep, abdomen tender to touch.     - SEVERE (8-10): Excruciating pain, doubled over, unable to do any normal activities.       6 7. RECURRENT SYMPTOM: "Have you ever had this type of stomach pain before?" If Yes, ask: "When was the last time?" and "What happened that time?"      September 2024 similar abdominal, went  to ER, and they did not find anything significant 8. CAUSE: "What do you think is causing the stomach pain?"     Unknown 9. RELIEVING/AGGRAVATING FACTORS: "What makes it better or worse?" (e.g., antacids, bending or twisting motion, bowel movement)     Standing and walking helps a little bit 10. OTHER SYMPTOMS: "Do you have any other symptoms?" (e.g., back pain, diarrhea, fever, urination pain, vomiting)       No  Answer Assessment - Initial Assessment Questions 1. LOCATION: "Where does it hurt?"      Forehead 2. ONSET: "When did the headache start?" (Minutes, hours or days)      8  days ago 3. PATTERN: "Does the pain come and go, or has it been constant since it started?"     Constant 4. SEVERITY: "How bad is the pain?" and "What does it keep you from doing?"  (e.g., Scale 1-10; mild, moderate, or severe)   - MILD (1-3): doesn't interfere with normal activities    - MODERATE (4-7): interferes with normal activities or awakens from sleep    - SEVERE (8-10): excruciating pain, unable to do any normal activities        8 5. RECURRENT SYMPTOM: "Have you ever had headaches before?" If Yes, ask: "When was the last time?" and "What happened that time?"      Years ago---PCP assessed nothing significant found 6. CAUSE: "What do you think is causing the headache?"     Unknown 7. MIGRAINE: "Have you been diagnosed with migraine headaches?" If Yes, ask: "Is this headache similar?"      No 8. HEAD INJURY: "Has there been any recent injury to the head?"      No 9. OTHER SYMPTOMS: "Do you have any other symptoms?" (fever, stiff neck, eye pain, sore throat, cold symptoms)     No  Protocols used: Abdominal Pain - Female-A-AH, Headache-A-AH

## 2023-06-30 NOTE — Patient Instructions (Signed)
The blood and urine tests I have ordered today are to rule out infection, and inflammation   I recommend treating your constipation with one of the following:  Metamucil Citrucel Benefiber  You may add colace to this (stool softener)

## 2023-06-30 NOTE — Telephone Encounter (Signed)
FYI

## 2023-06-30 NOTE — Progress Notes (Signed)
Subjective:  Patient ID: Susan Stanley, female    DOB: [birth year]  Age: 73 y.o. MRN: [id number]  CC: The primary encounter diagnosis was Headache, new daily persistent ([facility]). Diagnoses of Generalized abdominal pain and Acute nonintractable headache, unspecified headache type were also pertinent to this visit.   HPI Susan Stanley presents for persistent headache and  lower abdominal pain for the past week   HPI : HA started one week ago while socializing with friends [place],  tender to palpation yesterday,  has been constant for the past week.  Occurred 7 years ago, lasted 14 days,  attributed then to a viral infection.   Has had other episodes of headaches lasting one week , but no prior neurology evaluation.     History of melanoma:  gets PETS every 6 months,  last one was in October.     However current headache is more severe than usual and has been present for one week and when severe was accompanied by nausea . She denies vision changes and  jaw claudication;  no occipital pain.  Has tried advil with no change so stopped using it.  The pain is not  positional.  No sinus pain or sinus symptoms   Abd pain started 6 days ago after headache became  more severe and accompanied by mild nausea,  followed by  intermittent ramping  in the lower abdomen that is intermittent and bilateral.  Her stools have been solid/soft formed.  No runny stools.  Smaller in volume and caliber than usual. She has no history of diverticulosis/itis by 2023 colonoscopy and 2024 CT abd and pelvis.   Aortic atherosclerosis noted on CT abd    Outpatient Medications Prior to Visit  Medication Sig Dispense Refill   amLODipine (NORVASC) 2.5 MG tablet Take 1 tablet (2.5 mg total) by mouth daily. 90 tablet 3   ascorbic acid (VITAMIN C) 500 MG tablet Take 500 mg by mouth daily.     Calcium 600-5 MG-MCG TABS Take 1 tablet by mouth daily.     Cholecalciferol 50 MCG (2000 UT) TABS Take 1 tablet by mouth daily.      Cyanocobalamin (VITAMIN B 12 PO) Take by mouth.     Magnesium 200 MG TABS Take by mouth. Take one - two daily     Omega-3 Fatty Acids (FISH OIL) 1000 MG CAPS Take by mouth.     omeprazole (PRILOSEC) 20 MG capsule TAKE 1 CAPSULE BY MOUTH EVERYDAY AT BEDTIME 90 capsule 3   dicyclomine (BENTYL) 10 MG capsule Take 1 capsule (10 mg total) by mouth 4 (four) times daily -  before meals and at bedtime. (Patient not taking: Reported on 06/30/2023) 30 capsule 0   azithromycin (ZITHROMAX) 250 MG tablet Take 1 tablet (250 mg total) by mouth daily. Take first 2 tablets together, then 1 every day until finished. (Patient not taking: Reported on 06/30/2023) 6 tablet 0   No facility-administered medications prior to visit.    Review of Systems;  Patient denies headache, fevers, malaise, unintentional weight loss, skin rash, eye pain, sinus congestion and sinus pain, sore throat, dysphagia,  hemoptysis , cough, dyspnea, wheezing, chest pain, palpitations, orthopnea, edema, abdominal pain, nausea, melena, diarrhea, constipation, flank pain, dysuria, hematuria, urinary  Frequency, nocturia, numbness, tingling, seizures,  Focal weakness, Loss of consciousness,  Tremor, insomnia, depression, anxiety, and suicidal ideation.      Objective:  BP (!) 142/90   Pulse (!) 118   Ht 5\' 5"  (1.651 m)   Wt  141 lb (64 kg)   SpO2 98%   BMI 23.46 kg/m   BP Readings from Last 3 Encounters:  06/30/23 (!) 142/90  06/06/23 (!) 159/90  02/08/23 (!) 187/96    Wt Readings from Last 3 Encounters:  06/30/23 141 lb (64 kg)  02/08/23 137 lb (62.1 kg)  12/13/22 138 lb (62.6 kg)    Physical Exam Vitals reviewed.  Constitutional:      General: She is not in acute distress.    Appearance: Normal appearance. She is normal weight. She is not ill-appearing, toxic-appearing or diaphoretic.  HENT:     Head: Normocephalic.  Eyes:     General: No scleral icterus.       Right eye: No discharge.        Left eye: No discharge.      Conjunctiva/sclera: Conjunctivae normal.  Cardiovascular:     Rate and Rhythm: Normal rate and regular rhythm.     Heart sounds: Normal heart sounds.  Pulmonary:     Effort: Pulmonary effort is normal. No respiratory distress.     Breath sounds: Normal breath sounds.  Abdominal:     General: Bowel sounds are normal. There is no distension.     Palpations: Abdomen is soft. There is no hepatomegaly or mass.  Musculoskeletal:        General: Normal range of motion.  Skin:    General: Skin is warm and dry.  Neurological:     General: No focal deficit present.     Mental Status: She is alert and oriented to person, place, and time. Mental status is at baseline.  Psychiatric:        Mood and Affect: Mood normal.        Behavior: Behavior normal.        Thought Content: Thought content normal.        Judgment: Judgment normal.    Lab Results  Component Value Date   HGBA1C 6.0 03/31/2021    Lab Results  Component Value Date   CREATININE 0.62 06/30/2023   CREATININE 0.59 02/08/2023   CREATININE 0.60 03/31/2021    Lab Results  Component Value Date   WBC 4.9 06/30/2023   HGB 14.0 06/30/2023   HCT 42.3 06/30/2023   PLT 315.0 06/30/2023   GLUCOSE 91 06/30/2023   CHOL 189 03/31/2021   TRIG 73.0 03/31/2021   HDL 53.80 03/31/2021   LDLCALC 121 (H) 03/31/2021   ALT 14 06/30/2023   AST 16 06/30/2023   NA 138 06/30/2023   K 4.0 06/30/2023   CL 101 06/30/2023   CREATININE 0.62 06/30/2023   BUN 8 06/30/2023   CO2 29 06/30/2023   TSH 2.00 09/10/2022   HGBA1C 6.0 03/31/2021    No results found.  Assessment & Plan:  .Headache, new daily persistent ([facility]) -     Sedimentation rate -     C-reactive protein -     CBC with Differential/Platelet  Generalized abdominal pain Assessment & Plan: Lipase, LFTs are normal. Plain abd films today suggest constipation may be the cause of her pain .  Recommend use of citrucel /stool softener to relieve constipation .  If not results, will  use cathartic   Orders: -     Urinalysis, Routine w reflex microscopic -     Urine Culture -     Comprehensive metabolic panel -     Lipase -     DG Abd 1 View; Future  Acute nonintractable headache, unspecified headache type Assessment &  Plan: Recurrent episodes of prolonged pain episodes over several years.  History of melanoma,  with last PET scan normal in [place]. .  Age adjusted ESR is normal.  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  Dispense: 20 tablet; Refill: 0     I provided 47 minutes of face-to-face time during this encounter reviewing patient's last visit with me, patient's  most recent visit with oncology, recent surgical and non surgical procedures, previous  labs and imaging studies, counseling on currently addressed issues,  and post visit ordering to diagnostics and therapeutics .   Follow-up: No follow-ups on file.   Sherlene Shams, MD

## 2023-06-30 NOTE — Assessment & Plan Note (Addendum)
Recurrent episodes of prolonged pain episodes over several years.  History of melanoma,  with last PET scan normal in [place]. .  Age adjusted ESR is normal.  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

## 2023-06-30 NOTE — Assessment & Plan Note (Signed)
Lipase, LFTs are normal. Plain abd films today suggest constipation may be the cause of her pain .  Recommend use of citrucel /stool softener to relieve constipation .  If not results, will use cathartic

## 2023-07-01 LAB — URINE CULTURE
MICRO NUMBER:: 15989320
Result:: NO GROWTH
SPECIMEN QUALITY:: ADEQUATE

## 2023-07-07 ENCOUNTER — Telehealth: Payer: Self-pay | Admitting: Gastroenterology

## 2023-07-07 NOTE — Telephone Encounter (Addendum)
The patient called in because she is having stomach pain and cramping. She wants to schedule appointment with Dr. Allegra Lai.The patient is scheduled on 07/19/23 at 1:15 pm in [place]. I had Misty Stanley to add an EGD to the appointment notes.

## 2023-07-09 ENCOUNTER — Encounter: Payer: Self-pay | Admitting: Internal Medicine

## 2023-07-09 DIAGNOSIS — G4452 New daily persistent headache (NDPH): Secondary | ICD-10-CM

## 2023-07-12 ENCOUNTER — Encounter: Payer: Self-pay | Admitting: Neurology

## 2023-07-15 ENCOUNTER — Encounter: Payer: Self-pay | Admitting: Internal Medicine

## 2023-07-15 ENCOUNTER — Ambulatory Visit (INDEPENDENT_AMBULATORY_CARE_PROVIDER_SITE_OTHER): Payer: Medicare Other | Admitting: Internal Medicine

## 2023-07-15 VITALS — BP 138/82 | HR 86 | Ht 65.0 in | Wt 139.8 lb

## 2023-07-15 DIAGNOSIS — G44019 Episodic cluster headache, not intractable: Secondary | ICD-10-CM | POA: Diagnosis not present

## 2023-07-15 MED ORDER — VERAPAMIL HCL ER 120 MG PO CP24: 120.0000 mg | ORAL_CAPSULE | Freq: Every day | ORAL | 2 refills | Status: DC

## 2023-07-15 NOTE — Patient Instructions (Addendum)
 Cluster headaches (and migraine headaches, for that matter ) can be prevented in some patients with daily use of verapamil  , taken once daily at bedtime  If you decide to try this medication,  you should suspend your amlodipine  *(ou don't need both)  I would keep the neurology and the herbalist appointments

## 2023-07-15 NOTE — Progress Notes (Addendum)
 Subjective:  Patient ID: Susan Stanley, female    DOB: November 27, 1950  Age: 73 y.o. MRN: [id number]  CC: The encounter diagnosis was Episodic cluster headache, not intractable.   HPI Susan Stanley presents for  Chief Complaint  Patient presents with   Headache    Pain in front almost top of head, dull intermittent for 23 days    Follow up on headaches:  she states that she has had a headache 18 out of 23 days since January 16 .  She notes that the headaches involve her frontal regions bilaterally and coincided  with the use  of new hearing aides (connected via [employer]) beucase she has taken them off  for several days and the headaches improved.  Described as dull but  at times intense enough to cause nausea.  Not brought on by bright light,  but   has photosensitivity,  no sinus symptoms as occasional sore throat.    Has a sore throat, slight,    Has appt  march 26  Feb 19 with neurology.  Also seeing an herbalist on Feb 19    Outpatient Medications Prior to Visit  Medication Sig Dispense Refill   amLODipine  (NORVASC ) 2.5 MG tablet Take 1 tablet (2.5 mg total) by mouth daily. 90 tablet 3   ascorbic acid (VITAMIN C) 500 MG tablet Take 500 mg by mouth daily.     Calcium 600-5 MG-MCG TABS Take 1 tablet by mouth daily.     Cholecalciferol  50 MCG (2000 UT) TABS Take 1 tablet by mouth daily.     Cyanocobalamin  (VITAMIN B 12 PO) Take by mouth.     Magnesium 200 MG TABS Take by mouth. Take one - two daily     Omega-3 Fatty Acids (FISH OIL) 1000 MG CAPS Take by mouth.     omeprazole  (PRILOSEC) 20 MG capsule TAKE 1 CAPSULE BY MOUTH EVERYDAY AT BEDTIME 90 capsule 3   dicyclomine  (BENTYL ) 10 MG capsule Take 1 capsule (10 mg total) by mouth 4 (four) times daily -  before meals and at bedtime. (Patient not taking: Reported on 07/15/2023) 30 capsule 0   ondansetron  (ZOFRAN -ODT) 4 MG disintegrating tablet Take 1 tablet (4 mg total) by mouth every 8 (eight) hours as needed for nausea or vomiting. (Patient not  taking: Reported on 07/15/2023) 20 tablet 0   No facility-administered medications prior to visit.    Review of Systems;  Patient denies headache, fevers, malaise, unintentional weight loss, skin rash, eye pain, sinus congestion and sinus pain, sore throat, dysphagia,  hemoptysis , cough, dyspnea, wheezing, chest pain, palpitations, orthopnea, edema, abdominal pain, nausea, melena, diarrhea, constipation, flank pain, dysuria, hematuria, urinary  Frequency, nocturia, numbness, tingling, seizures,  Focal weakness, Loss of consciousness,  Tremor, insomnia, depression, anxiety, and suicidal ideation.      Objective:  BP 138/82 (BP Location: Left Arm, Patient Position: Sitting, Cuff Size: Normal)   Pulse 86   Ht 5' 5 (1.651 m)   Wt 139 lb 12.8 oz (63.4 kg)   SpO2 96%   BMI 23.26 kg/m   BP Readings from Last 3 Encounters:  07/19/23 (!) 177/83  07/15/23 138/82  06/30/23 (!) 142/90    Wt Readings from Last 3 Encounters:  07/19/23 140 lb (63.5 kg)  07/15/23 139 lb 12.8 oz (63.4 kg)  06/30/23 141 lb (64 kg)    Physical Exam Vitals reviewed.  Constitutional:      General: She is not in acute distress.    Appearance: Normal  appearance. She is normal weight. She is not ill-appearing, toxic-appearing or diaphoretic.  HENT:     Head: Normocephalic.  Eyes:     General: No scleral icterus.       Right eye: No discharge.        Left eye: No discharge.     Conjunctiva/sclera: Conjunctivae normal.  Cardiovascular:     Rate and Rhythm: Normal rate and regular rhythm.     Heart sounds: Normal heart sounds.  Pulmonary:     Effort: Pulmonary effort is normal. No respiratory distress.     Breath sounds: Normal breath sounds.  Musculoskeletal:        General: Normal range of motion.  Skin:    General: Skin is warm and dry.  Neurological:     General: No focal deficit present.     Mental Status: She is alert and oriented to person, place, and time. Mental status is at baseline.      Cranial Nerves: Cranial nerves 2-12 are intact.     Sensory: Sensation is intact.     Deep Tendon Reflexes: Reflexes are normal and symmetric.  Psychiatric:        Mood and Affect: Mood normal.        Behavior: Behavior normal.        Thought Content: Thought content normal.        Judgment: Judgment normal.    Lab Results  Component Value Date   HGBA1C 6.0 03/31/2021    Lab Results  Component Value Date   CREATININE 0.62 06/30/2023   CREATININE 0.59 02/08/2023   CREATININE 0.60 03/31/2021    Lab Results  Component Value Date   WBC 4.9 06/30/2023   HGB 14.0 06/30/2023   HCT 42.3 06/30/2023   PLT 315.0 06/30/2023   GLUCOSE 91 06/30/2023   CHOL 189 03/31/2021   TRIG 73.0 03/31/2021   HDL 53.80 03/31/2021   LDLCALC 121 (H) 03/31/2021   ALT 14 06/30/2023   AST 16 06/30/2023   NA 138 06/30/2023   K 4.0 06/30/2023   CL 101 06/30/2023   CREATININE 0.62 06/30/2023   BUN 8 06/30/2023   CO2 29 06/30/2023   TSH 2.00 09/10/2022   HGBA1C 6.0 03/31/2021    No results found.  Assessment & Plan:  .Episodic cluster headache, not intractable Assessment & Plan: Pattern is suggestive of cluster headaches.  Neurologic exam is normal.  Headaches  Occurring on   18 of the last  23 days.   Trial of verapamil  taken nightly    advised to stop amlodipine  . Has an appt with neurology in March       Follow-up: No follow-ups on file.   Verneita LITTIE Kettering, MD

## 2023-07-17 NOTE — Assessment & Plan Note (Signed)
 Pattern is suggestive of cluster headaches.  Neurologic exam is normal.  Headaches  Occurring on   18 of the last  23 days.   Trial of verapamil  taken nightly    advised to stop amlodipine  . Has an appt with neurology in March

## 2023-07-19 ENCOUNTER — Ambulatory Visit (INDEPENDENT_AMBULATORY_CARE_PROVIDER_SITE_OTHER): Payer: Medicare Other | Admitting: Gastroenterology

## 2023-07-19 ENCOUNTER — Encounter: Payer: Self-pay | Admitting: Gastroenterology

## 2023-07-19 ENCOUNTER — Other Ambulatory Visit: Payer: Self-pay

## 2023-07-19 VITALS — BP 177/83 | HR 86 | Temp 97.2°F | Ht 65.0 in | Wt 140.0 lb

## 2023-07-19 DIAGNOSIS — R1013 Epigastric pain: Secondary | ICD-10-CM

## 2023-07-19 DIAGNOSIS — Z860101 Personal history of adenomatous and serrated colon polyps: Secondary | ICD-10-CM

## 2023-07-19 DIAGNOSIS — K2270 Barrett's esophagus without dysplasia: Secondary | ICD-10-CM | POA: Diagnosis not present

## 2023-07-19 DIAGNOSIS — K760 Fatty (change of) liver, not elsewhere classified: Secondary | ICD-10-CM | POA: Diagnosis not present

## 2023-07-19 DIAGNOSIS — K900 Celiac disease: Secondary | ICD-10-CM | POA: Diagnosis not present

## 2023-07-19 DIAGNOSIS — C439 Malignant melanoma of skin, unspecified: Secondary | ICD-10-CM | POA: Insufficient documentation

## 2023-07-19 NOTE — Addendum Note (Signed)
Addended by: Radene Knee L on: 07/19/2023 01:54 PM   Modules accepted: Orders

## 2023-07-19 NOTE — Progress Notes (Signed)
Arlyss Repress, MD [address]  [address]  [place], [place] [address]  Main: [phone]  Fax: [phone]    Gastroenterology Consultation  Referring Provider:     Allegra Grana, FNP Primary Care Physician:  Allegra Grana, FNP Primary Gastroenterologist:  Dr. Arlyss Repress Reason for Consultation:   Celiac disease, fatty liver disease, colon adenoma        HPI:   Susan Stanley is a 73 y.o. Caucasian female referred by Dr. Jason Coop, Lyn Records, FNP  for consultation & management of history of celiac disease, fatty liver, colon polyp Patient reports that she was diagnosed with celiac disease in 2007 based on the blood test and endoscopy as she was experiencing bloating, diarrhea.  She went on a gluten-free diet as recommended, also consulted nutritionist during that time.  Her symptoms resolved on a gluten-free diet.  She moved from [place] about 1 and half years ago.  She had liver test performed as part of physical on 10/12/2017, found to have isolated elevated alkaline phosphatase to 149, ultrasound revealed mild hepatic steatosis.  She researched online and found association between celiac disease and elevated LFTs.  Hep C antibody was negative.  She said she was cheating on a gluten-free diet prior to this.  She then went on a strict gluten-free diet, that lead to some weight loss.  Her LFTs returned to normal. She was also found to have tubular adenoma in the rectum in 05/2014 She was taking omeprazole for heartburn which she recently stopped.  Currently, taking apple cider vinegar with garlic as needed for reflux symptoms.  She denies any GI symptoms today She does not drink alcohol or smoke cigarettes  Follow-up visit 08/30/2022 Patient is here for regular follow-up of celiac disease and fatty liver.  She has been doing well.  She has been intentionally eating gluten containing cookies intermittently without having any symptoms.  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  She does report occasional chest spasm and she thinks is from reflux only.  She does not have any other concerns today.  Follow-up visit 07/19/2023 [name] is here for follow-up of symptoms of abdominal cramps, headache, abdominal bloating which started in June 2024.  She went to ER, underwent workup including imaging studies which were negative.  She was told that she may have mild constipation.  She does report consumption of processed foods as well as drinks mostly seltzer.  Her symptoms lasted for about 8 to 10 days.  She had another flareup in mid January 2025 which lasted for about 8 to 10 days.  She is confident about avoiding gluten exposure.  She is cutting back on seltzer and feels that her abdominal pain is somewhat better.  She reports having 3-4 bowel movements in the morning which is usual for her.  Her labs were unremarkable including CBC, CMP, CRP, thyroid panel.  She is requesting upper endoscopy for further evaluation  NSAIDs: None  Antiplts/Anticoagulants/Anti thrombotics: None  GI Procedures:  EGD and colonoscopy 10/08/2019 Submucosal nodule found in the duodenum, biopsied Small hiatal hernia Normal stomach Normal retroflexion in the stomach Salmon-colored mucosa suspicious for short segment Barrett's esophagus  Excellent prep, 3 subcentimeter polyps, pathology revealed tubular adenomas  DIAGNOSIS: A. DUODENUM; COLD BIOPSY: - ENTERIC MUCOSA WITH PRESERVED VILLOUS ARCHITECTURE AND NO SIGNIFICANT HISTOPATHOLOGIC CHANGE. - NEGATIVE FOR FEATURES OF CELIAC, DYSPLASIA, AND MALIGNANCY.  B. GASTROESOPHAGEAL JUNCTION; COLD  BIOPSY: - COLUMNAR MUCOSA WITH INTESTINAL METAPLASIA, COMPATIBLE WITH BARRETT'S ESOPHAGUS. - SQUAMOUS MUCOSA WITH FEATURES OF MILD REFLUX ESOPHAGITIS. - NEGATIVE FOR DYSPLASIA AND MALIGNANCY.  C. COLON POLYP, CECUM; COLD SNARE: - TUBULAR ADENOMA. - NEGATIVE FOR  HIGH-GRADE DYSPLASIA AND MALIGNANCY.  D. COLON POLYPS X2, TRANSVERSE; COLD SNARE: - MULTIPLE FRAGMENTS OF TUBULAR ADENOMAS. - NEGATIVE FOR HIGH-GRADE DYSPLASIA AND MALIGNANCY.  E. COLON POLYP, DESCENDING; COLD BIOPSY: - TUBULAR ADENOMA. - NEGATIVE FOR HIGH-GRADE DYSPLASIA AND MALIGNANCY.     EGD and colonoscopy in 05/2014 in [place] Esophageal, gastric and duodenal biopsies were unremarkable Colonoscopy revealed a 12 mm polyp in the rectum that was removed with hot snare Pathology showed tubular adenoma only Bowel prep was adequate per report She denies any family history of GI malignancy  Past Medical History:  Diagnosis Date   Cancer (HCC)    atypical melanoma   Celiac disease    x20 years   Chicken pox    Hypertension     Past Surgical History:  Procedure Laterality Date   APPENDECTOMY     COLONOSCOPY WITH PROPOFOL N/A 10/08/2019   Procedure: COLONOSCOPY WITH PROPOFOL;  Surgeon: Toney Reil, MD;  Location: [facility] ENDOSCOPY;  Service: Gastroenterology;  Laterality: N/A;   ESOPHAGOGASTRODUODENOSCOPY (EGD) WITH PROPOFOL N/A 10/08/2019   Procedure: ESOPHAGOGASTRODUODENOSCOPY (EGD) WITH PROPOFOL;  Surgeon: Toney Reil, MD;  Location: [facility] ENDOSCOPY;  Service: Gastroenterology;  Laterality: N/A;   melanoma removal  2011   top of head   melanoma removal from back  08/2022   MOHS SURGERY Left    Face     Current Outpatient Medications:    amLODipine (NORVASC) 2.5 MG tablet, Take 1 tablet (2.5 mg total) by mouth daily., Disp: 90 tablet, Rfl: 3   ascorbic acid (VITAMIN C) 500 MG tablet, Take 500 mg by mouth daily., Disp: , Rfl:    Calcium 600-5 MG-MCG TABS, Take 1 tablet by mouth daily., Disp: , Rfl:    Cholecalciferol 50 MCG (2000 UT) TABS, Take 1 tablet by mouth daily., Disp: , Rfl:    Cyanocobalamin (VITAMIN B 12 PO), Take by mouth., Disp: , Rfl:    Magnesium 200 MG TABS, Take by mouth. Take one - two daily, Disp: , Rfl:    Omega-3 Fatty Acids (FISH  OIL) 1000 MG CAPS, Take by mouth., Disp: , Rfl:    omeprazole (PRILOSEC) 20 MG capsule, TAKE 1 CAPSULE BY MOUTH EVERYDAY AT BEDTIME, Disp: 90 capsule, Rfl: 3  Family History  Problem Relation Age of Onset   Alcohol abuse Mother    COPD Mother    Alcohol abuse Father    Cancer Father        lung   Hypertension Father    Alcohol abuse Sister    Diabetes Maternal Grandfather    Arthritis Paternal Grandmother    Cancer Paternal Grandfather        Colon   Breast cancer Neg Hx      Social History   Tobacco Use   Smoking status: Never   Smokeless tobacco: Never  Vaping Use   Vaping status: Never Used  Substance Use Topics   Alcohol use: No   Drug use: No    Allergies as of 07/19/2023 - Review Complete 07/19/2023  Allergen Reaction Noted   Cat dander  08/04/2011   Gluten meal  08/04/2011   Penicillin g  01/20/2017    Review of Systems:    All systems reviewed and negative except where noted in HPI.  Physical Exam:  BP (!) 177/83 (BP Location: Left Arm, Patient Position: Sitting, Cuff Size: Normal)   Pulse 86   Temp (!) 97.2 F (36.2 C) (Oral)   Ht 5\' 5"  (1.651 m)   Wt 140 lb (63.5 kg)   BMI 23.30 kg/m  No LMP recorded. Patient is postmenopausal.  General:   Alert,  Well-developed, well-nourished, pleasant and cooperative in NAD Head:  Normocephalic and atraumatic. Eyes:  Sclera clear, no icterus.   Conjunctiva pink. Ears:  Normal auditory acuity. Nose:  No deformity, discharge, or lesions. Mouth:  No deformity or lesions,oropharynx pink & moist. Neck:  Supple; no masses or thyromegaly. Lungs:  Respirations even and unlabored.  Clear throughout to auscultation.   No wheezes, crackles, or rhonchi. No acute distress. Heart:  Regular rate and rhythm; no murmurs, clicks, rubs, or gallops. Abdomen:  Normal bowel sounds. Soft, non-tender and mildly distended, tympanic without masses, hepatosplenomegaly or hernias noted.  No guarding or rebound tenderness.   Rectal: Not  performed Msk:  Symmetrical without gross deformities. Good, equal movement & strength bilaterally. Pulses:  Normal pulses noted. Extremities:  No clubbing or edema.  No cyanosis. Neurologic:  Alert and oriented x3;  grossly normal neurologically. Skin:  Intact without significant lesions or rashes. No jaundice. Psych:  Alert and cooperative. Normal mood and affect.  Imaging Studies: Reviewed  Assessment and Plan:   Kiyanna Aitken is a 73 y.o. Caucasian female with reported history of celiac disease, in endoscopic remission on a gluten-free diet, mild fatty liver, tubular adenoma of the colon  Celiac disease: Recent symptoms of abdominal cramps, abdominal bloating Endoscopic remission based on duodenal biopsies in 2021 Recommend EGD with gastric and duodenal biopsies Continue gluten-free diet check celiac serologies Advised to cut back on processed foods, beverages  Hepatic steatosis and isolated elevated alkaline phosphatase There is certainly an association between celiac disease and elevated LFTs LFTs are now normal on strict gluten-free diet Hep C antibody negative No further work-up needed at this time Continue healthy lifestyle, patient is reassured  Short segment Barrett's esophagus without dysplasia Continue omeprazole 20 mg before dinner  Tubular adenomas: Repeat surveillance colonoscopy in 2026   Follow up based on the above workup   Arlyss Repress, MD

## 2023-07-21 ENCOUNTER — Encounter: Payer: Self-pay | Admitting: Gastroenterology

## 2023-07-21 ENCOUNTER — Encounter
Admission: RE | Disposition: A | Discharge status: Home / Self Care | Payer: Self-pay | Source: Home / Self Care | Attending: Gastroenterology

## 2023-07-21 ENCOUNTER — Ambulatory Visit: Payer: Medicare Other | Admitting: Anesthesiology

## 2023-07-21 ENCOUNTER — Ambulatory Visit
Admission: RE | Admit: 2023-07-21 | Discharge: 2023-07-21 | Disposition: A | Discharge status: Home / Self Care | Payer: Medicare Other | Attending: Gastroenterology | Admitting: Gastroenterology

## 2023-07-21 ENCOUNTER — Encounter: Payer: Self-pay | Admitting: Internal Medicine

## 2023-07-21 DIAGNOSIS — G44019 Episodic cluster headache, not intractable: Secondary | ICD-10-CM

## 2023-07-21 DIAGNOSIS — K449 Diaphragmatic hernia without obstruction or gangrene: Secondary | ICD-10-CM | POA: Insufficient documentation

## 2023-07-21 DIAGNOSIS — J9801 Acute bronchospasm: Secondary | ICD-10-CM | POA: Insufficient documentation

## 2023-07-21 DIAGNOSIS — I10 Essential (primary) hypertension: Secondary | ICD-10-CM | POA: Diagnosis not present

## 2023-07-21 DIAGNOSIS — K3189 Other diseases of stomach and duodenum: Secondary | ICD-10-CM

## 2023-07-21 DIAGNOSIS — K31A0 Gastric intestinal metaplasia, unspecified: Secondary | ICD-10-CM

## 2023-07-21 DIAGNOSIS — R1013 Epigastric pain: Secondary | ICD-10-CM | POA: Insufficient documentation

## 2023-07-21 DIAGNOSIS — Z8249 Family history of ischemic heart disease and other diseases of the circulatory system: Secondary | ICD-10-CM | POA: Insufficient documentation

## 2023-07-21 DIAGNOSIS — K900 Celiac disease: Secondary | ICD-10-CM | POA: Diagnosis not present

## 2023-07-21 HISTORY — PX: BIOPSY: SHX5522

## 2023-07-21 HISTORY — PX: ESOPHAGOGASTRODUODENOSCOPY (EGD) WITH PROPOFOL: SHX5813

## 2023-07-21 LAB — CELIAC DISEASE PANEL: IgA/Immunoglobulin A, Serum: 387 mg/dL (ref 64–422)

## 2023-07-21 SURGERY — ESOPHAGOGASTRODUODENOSCOPY (EGD) WITH PROPOFOL
Anesthesia: General

## 2023-07-21 MED ORDER — SODIUM CHLORIDE 0.9 % IV SOLN
INTRAVENOUS | Status: DC
Start: 2023-07-21 — End: 2023-07-21

## 2023-07-21 MED ORDER — PROPOFOL 10 MG/ML IV BOLUS
INTRAVENOUS | Status: DC | PRN
  Administered 2023-07-21: 30 mg via INTRAVENOUS
  Administered 2023-07-21: 60 mg via INTRAVENOUS
  Administered 2023-07-21 (×2): 20 mg via INTRAVENOUS

## 2023-07-21 MED ORDER — LIDOCAINE HCL (CARDIAC) PF 100 MG/5ML IV SOSY
PREFILLED_SYRINGE | INTRAVENOUS | Status: DC | PRN
  Administered 2023-07-21: 60 mg via INTRAVENOUS

## 2023-07-21 MED ORDER — DEXMEDETOMIDINE HCL IN NACL 80 MCG/20ML IV SOLN
INTRAVENOUS | Status: DC | PRN
  Administered 2023-07-21: 8 ug via INTRAVENOUS

## 2023-07-21 NOTE — H&P (Signed)
Arlyss Repress, MD [address]  [address]  [place], [place] [address]  Main: [phone]  Fax: [phone] Pager: [phone]  Primary Care Physician:  Allegra Grana, FNP Primary Gastroenterologist:  Dr. Arlyss Repress  Pre-Procedure History & Physical: HPI:  Susan Stanley is a 73 y.o. female is here for an endoscopy.   Past Medical History:  Diagnosis Date   Cancer (HCC)    atypical melanoma   Celiac disease    x20 years   Chicken pox    Hypertension     Past Surgical History:  Procedure Laterality Date   APPENDECTOMY     COLONOSCOPY WITH PROPOFOL N/A 10/08/2019   Procedure: COLONOSCOPY WITH PROPOFOL;  Surgeon: Toney Reil, MD;  Location: [facility] ENDOSCOPY;  Service: Gastroenterology;  Laterality: N/A;   ESOPHAGOGASTRODUODENOSCOPY (EGD) WITH PROPOFOL N/A 10/08/2019   Procedure: ESOPHAGOGASTRODUODENOSCOPY (EGD) WITH PROPOFOL;  Surgeon: Toney Reil, MD;  Location: [facility] ENDOSCOPY;  Service: Gastroenterology;  Laterality: N/A;   melanoma removal  2011   top of head   melanoma removal from back  08/2022   MOHS SURGERY Left    Face    Prior to Admission medications   Medication Sig Start Date End Date Taking? Authorizing Provider  amLODipine (NORVASC) 2.5 MG tablet Take 1 tablet (2.5 mg total) by mouth daily. 05/14/22   Allegra Grana, FNP  ascorbic acid (VITAMIN C) 500 MG tablet Take 500 mg by mouth daily.    [provider]  Calcium 600-5 MG-MCG TABS Take 1 tablet by mouth daily.    [provider]  Cholecalciferol 50 MCG (2000 UT) TABS Take 1 tablet by mouth daily.    [provider]  Cyanocobalamin (VITAMIN B 12 PO) Take by mouth.    [provider]  Magnesium 200 MG TABS Take by mouth. Take one - two daily    [provider]  Omega-3 Fatty Acids (FISH OIL) 1000 MG CAPS Take by mouth.    [provider]  omeprazole (PRILOSEC) 20 MG capsule TAKE 1 CAPSULE BY MOUTH EVERYDAY AT BEDTIME 11/08/22    Toney Reil, MD    Allergies as of 07/19/2023 - Review Complete 07/19/2023  Allergen Reaction Noted   Cat dander  08/04/2011   Gluten meal  08/04/2011   Penicillin g  01/20/2017    Family History  Problem Relation Age of Onset   Alcohol abuse Mother    COPD Mother    Alcohol abuse Father    Cancer Father        lung   Hypertension Father    Alcohol abuse Sister    Diabetes Maternal Grandfather    Arthritis Paternal Grandmother    Cancer Paternal Grandfather        Colon   Breast cancer Neg Hx     Social History   Socioeconomic History   Marital status: Divorced    Spouse name: Not on file   Number of children: Not on file   Years of education: Not on file   Highest education level: Not on file  Occupational History   Not on file  Tobacco Use   Smoking status: Never   Smokeless tobacco: Never  Vaping Use   Vaping status: Never Used  Substance and Sexual Activity   Alcohol use: No   Drug use: No   Sexual activity: Not Currently    Birth control/protection: Post-menopausal  Other Topics Concern   Not on file  Social History Narrative  Daughter is Rockney Ghee      From [place]            Social Drivers of Health   Financial Resource Strain: Low Risk  (12/13/2022)   Overall Financial Resource Strain (CARDIA)    Difficulty of Paying Living Expenses: Not hard at all  Food Insecurity: No Food Insecurity (12/13/2022)   Hunger Vital Sign    Worried About Running Out of Food in the Last Year: Never true    Ran Out of Food in the Last Year: Never true  Transportation Needs: No Transportation Needs (12/13/2022)   PRAPARE - Administrator, Civil Service (Medical): No    Lack of Transportation (Non-Medical): No  Physical Activity: Insufficiently Active (12/13/2022)   Exercise Vital Sign    Days of Exercise per Week: 4 days    Minutes of Exercise per Session: 20 min  Stress: No Stress Concern Present (12/13/2022)   [employer] of Occupational  Health - Occupational Stress Questionnaire    Feeling of Stress : Not at all  Social Connections: Moderately Isolated (12/13/2022)   Social Connection and Isolation Panel [NHANES]    Frequency of Communication with Friends and Family: More than three times a week    Frequency of Social Gatherings with Friends and Family: More than three times a week    Attends Religious Services: Never    Database administrator or Organizations: Yes    Attends Engineer, structural: More than 4 times per year    Marital Status: Divorced  Intimate Partner Violence: Not At Risk (12/13/2022)   Humiliation, Afraid, Rape, and Kick questionnaire    Fear of Current or Ex-Partner: No    Emotionally Abused: No    Physically Abused: No    Sexually Abused: No    Review of Systems: See HPI, otherwise negative ROS  Physical Exam: BP (!) 152/78   Pulse 74   Temp (!) 97.5 F (36.4 C) (Temporal)   Resp 18   Ht 5\' 5"  (1.651 m)   Wt 62.8 kg   SpO2 99%   BMI 23.03 kg/m  General:   Alert,  pleasant and cooperative in NAD Head:  Normocephalic and atraumatic. Neck:  Supple; no masses or thyromegaly. Lungs:  Clear throughout to auscultation.    Heart:  Regular rate and rhythm. Abdomen:  Soft, nontender and nondistended. Normal bowel sounds, without guarding, and without rebound.   Neurologic:  Alert and  oriented x4;  grossly normal neurologically.  Impression/Plan: Susan Stanley is here for an endoscopy to be performed for Celiac disease: Recent symptoms of abdominal cramps, abdominal bloating   Risks, benefits, limitations, and alternatives regarding  endoscopy have been reviewed with the patient.  Questions have been answered.  All parties agreeable.   Lannette Donath, MD  07/21/2023, 1:55 PM

## 2023-07-21 NOTE — Anesthesia Postprocedure Evaluation (Signed)
Anesthesia Post Note  Patient: Liberti Volkman  Procedure(s) Performed: ESOPHAGOGASTRODUODENOSCOPY (EGD) WITH PROPOFOL BIOPSY  Patient location during evaluation: Endoscopy Anesthesia Type: General Level of consciousness: awake and alert Pain management: pain level controlled Vital Signs Assessment: post-procedure vital signs reviewed and stable Respiratory status: spontaneous breathing, nonlabored ventilation, respiratory function stable and patient connected to nasal cannula oxygen Cardiovascular status: blood pressure returned to baseline and stable Postop Assessment: no apparent nausea or vomiting Anesthetic complications: no  No notable events documented.   Last Vitals:  Vitals:   07/21/23 1516 07/21/23 1528  BP: (!) 141/79 (!) 144/96  Pulse: 83 75  Resp: 12 15  Temp:    SpO2: 97% 97%    Last Pain:  Vitals:   07/21/23 1528  TempSrc:   PainSc: 0-No pain                 Stephanie Coup

## 2023-07-21 NOTE — Transfer of Care (Signed)
Immediate Anesthesia Transfer of Care Note  Patient: Susan Stanley  Procedure(s) Performed: ESOPHAGOGASTRODUODENOSCOPY (EGD) WITH PROPOFOL BIOPSY  Patient Location: PACU  Anesthesia Type:General  Level of Consciousness: awake, alert , and oriented  Airway & Oxygen Therapy: Patient Spontanous Breathing  Post-op Assessment: Report given to RN and Post -op Vital signs reviewed and stable  Post vital signs: Reviewed and stable  Last Vitals:  Vitals Value Taken Time  BP 125/72 07/21/23 1506  Temp 36.2 C 07/21/23 1506  Pulse 88 07/21/23 1509  Resp 17 07/21/23 1509  SpO2 96 % 07/21/23 1509  Vitals shown include unfiled device data.  Last Pain:  Vitals:   07/21/23 1506  TempSrc: Temporal  PainSc:          Complications: No notable events documented.

## 2023-07-21 NOTE — Anesthesia Preprocedure Evaluation (Addendum)
Anesthesia Evaluation  Patient identified by MRN, date of birth, ID band Patient awake    Reviewed: Allergy & Precautions, H&P , NPO status , Patient's Chart, lab work & pertinent test results  Airway Mallampati: II  TM Distance: >3 FB Neck ROM: full    Dental no notable dental hx.    Pulmonary neg pulmonary ROS   Pulmonary exam normal        Cardiovascular hypertension, Normal cardiovascular exam     Neuro/Psych negative neurological ROS  negative psych ROS   GI/Hepatic Neg liver ROS,GERD  ,,  Endo/Other  negative endocrine ROS    Renal/GU negative Renal ROS  negative genitourinary   Musculoskeletal   Abdominal Normal abdominal exam  (+)   Peds  Hematology negative hematology ROS (+)   Anesthesia Other Findings Past Medical History: No date: Cancer (HCC)     Comment:  atypical melanoma No date: Celiac disease     Comment:  x20 years No date: Chicken pox No date: Hypertension  Past Surgical History: No date: APPENDECTOMY 10/08/2019: COLONOSCOPY WITH PROPOFOL; N/A     Comment:  Procedure: COLONOSCOPY WITH PROPOFOL;  Surgeon: Toney Reil, MD;  Location: [facility] ENDOSCOPY;  Service:               Gastroenterology;  Laterality: N/A; 10/08/2019: ESOPHAGOGASTRODUODENOSCOPY (EGD) WITH PROPOFOL; N/A     Comment:  Procedure: ESOPHAGOGASTRODUODENOSCOPY (EGD) WITH               PROPOFOL;  Surgeon: Toney Reil, MD;  Location:               [facility] ENDOSCOPY;  Service: Gastroenterology;  Laterality:               N/A; 2011: melanoma removal     Comment:  top of head 08/2022: melanoma removal from back No date: MOHS SURGERY; Left     Comment:  Face     Reproductive/Obstetrics negative OB ROS                             Anesthesia Physical Anesthesia Plan  ASA: 2  Anesthesia Plan: General   Post-op Pain Management: Minimal or no pain anticipated   Induction:  Intravenous  PONV Risk Score and Plan: Propofol infusion and TIVA  Airway Management Planned: Natural Airway  Additional Equipment:   Intra-op Plan:   Post-operative Plan:   Informed Consent: I have reviewed the patients History and Physical, chart, labs and discussed the procedure including the risks, benefits and alternatives for the proposed anesthesia with the patient or authorized representative who has indicated his/her understanding and acceptance.     Dental Advisory Given  Plan Discussed with: CRNA and Surgeon  Anesthesia Plan Comments:         Anesthesia Quick Evaluation

## 2023-07-21 NOTE — Op Note (Signed)
[facility] Gastroenterology Patient Name: Susan Stanley Procedure Date: 07/21/2023 2:42 PM MRN: [id number] Account #: [id number] Date of Birth: 08-25-1950 Admit Type: Outpatient Age: 73 Room: [facility] ENDO ROOM 3 Gender: Female Note Status: Finalized Instrument Name: Patton Salles Endoscope [id number] Procedure:             Upper GI endoscopy Indications:           Dyspepsia, Follow-up of celiac disease Providers:             Toney Reil MD, MD Referring MD:          Lyn Records. Arnett (Referring MD) Medicines:             General Anesthesia Complications:         No immediate complications. Estimated blood loss: None. Procedure:             Pre-Anesthesia Assessment:                        - Prior to the procedure, a History and Physical was                         performed, and patient medications and allergies were                         reviewed. The patient is competent. The risks and                         benefits of the procedure and the sedation options and                         risks were discussed with the patient. All questions                         were answered and informed consent was obtained.                         Patient identification and proposed procedure were                         verified by the physician, the nurse, the                         anesthesiologist, the anesthetist and the technician                         in the pre-procedure area in the procedure room in the                         endoscopy suite. Mental Status Examination: alert and                         oriented. Airway Examination: normal oropharyngeal                         airway and neck mobility. Respiratory Examination:                         clear to auscultation. CV Examination: normal.  Prophylactic Antibiotics: The patient does not require                         prophylactic antibiotics. Prior Anticoagulants: The                          patient has taken no anticoagulant or antiplatelet                         agents. ASA Grade Assessment: II - A patient with mild                         systemic disease. After reviewing the risks and                         benefits, the patient was deemed in satisfactory                         condition to undergo the procedure. The anesthesia                         plan was to use general anesthesia. Immediately prior                         to administration of medications, the patient was                         re-assessed for adequacy to receive sedatives. The                         heart rate, respiratory rate, oxygen saturations,                         blood pressure, adequacy of pulmonary ventilation, and                         response to care were monitored throughout the                         procedure. The physical status of the patient was                         re-assessed after the procedure.                        After obtaining informed consent, the endoscope was                         passed under direct vision. Throughout the procedure,                         the patient's blood pressure, pulse, and oxygen                         saturations were monitored continuously. The Endoscope                         was introduced through the mouth, and advanced to the  second part of duodenum. The upper GI endoscopy was                         performed with moderate difficulty due to the                         patient's respiratory instability (bronchospasm).                         Successful completion of the procedure was aided by                         managing the patient's medical instability. The                         patient tolerated the procedure fairly well. Findings:      The duodenal bulb and second portion of the duodenum were normal.       Biopsies for histology were taken with a cold forceps for evaluation of        celiac disease.      A 2 cm hiatal hernia was present.      The entire examined stomach was normal. Biopsies were taken with a cold       forceps for histology.      The cardia and gastric fundus were normal on retroflexion.      The gastroesophageal junction and examined esophagus were normal. Impression:            - Normal duodenal bulb and second portion of the                         duodenum. Biopsied.                        - 2 cm hiatal hernia.                        - Normal stomach. Biopsied.                        - Normal gastroesophageal junction and esophagus. Recommendation:        - Await pathology results.                        - Discharge patient to home (with escort).                        - Resume previous diet today.                        - Continue present medications. Procedure Code(s):     --- Professional ---                        [phone], Esophagogastroduodenoscopy, flexible,                         transoral; with biopsy, single or multiple Diagnosis Code(s):     --- Professional ---                        R10.13, Epigastric pain  K44.9, Diaphragmatic hernia without obstruction or                         gangrene                        K90.0, Celiac disease CPT copyright 2022 American Medical Association. All rights reserved. The codes documented in this report are preliminary and upon coder review may  be revised to meet current compliance requirements. Dr. Libby Maw Toney Reil MD, MD 07/21/2023 3:04:05 PM This report has been signed electronically. Number of Addenda: 0 Note Initiated On: 07/21/2023 2:42 PM Estimated Blood Loss:  Estimated blood loss: none.      [facility]

## 2023-07-22 ENCOUNTER — Encounter: Payer: Self-pay | Admitting: Gastroenterology

## 2023-07-22 LAB — SURGICAL PATHOLOGY

## 2023-07-26 ENCOUNTER — Encounter: Payer: Self-pay | Admitting: Gastroenterology

## 2023-07-31 ENCOUNTER — Other Ambulatory Visit: Payer: Self-pay | Admitting: Family

## 2023-07-31 DIAGNOSIS — I10 Essential (primary) hypertension: Secondary | ICD-10-CM

## 2023-08-29 DIAGNOSIS — G43709 Chronic migraine without aura, not intractable, without status migrainosus: Secondary | ICD-10-CM | POA: Diagnosis not present

## 2023-08-31 ENCOUNTER — Other Ambulatory Visit: Payer: Self-pay | Admitting: Neurology

## 2023-08-31 ENCOUNTER — Ambulatory Visit: Payer: Medicare Other | Admitting: Neurology

## 2023-08-31 DIAGNOSIS — G43709 Chronic migraine without aura, not intractable, without status migrainosus: Secondary | ICD-10-CM

## 2023-09-01 ENCOUNTER — Ambulatory Visit
Admission: RE | Admit: 2023-09-01 | Discharge: 2023-09-01 | Disposition: A | Discharge status: Home / Self Care | Source: Ambulatory Visit | Attending: Neurology | Admitting: Neurology

## 2023-09-01 DIAGNOSIS — G43709 Chronic migraine without aura, not intractable, without status migrainosus: Secondary | ICD-10-CM | POA: Insufficient documentation

## 2023-09-07 DIAGNOSIS — H903 Sensorineural hearing loss, bilateral: Secondary | ICD-10-CM | POA: Diagnosis not present

## 2023-09-07 DIAGNOSIS — H6123 Impacted cerumen, bilateral: Secondary | ICD-10-CM | POA: Diagnosis not present

## 2023-09-08 DIAGNOSIS — G43109 Migraine with aura, not intractable, without status migrainosus: Secondary | ICD-10-CM | POA: Diagnosis not present

## 2023-09-08 DIAGNOSIS — H43813 Vitreous degeneration, bilateral: Secondary | ICD-10-CM | POA: Diagnosis not present

## 2023-10-18 DIAGNOSIS — K449 Diaphragmatic hernia without obstruction or gangrene: Secondary | ICD-10-CM | POA: Diagnosis not present

## 2023-10-18 DIAGNOSIS — G43709 Chronic migraine without aura, not intractable, without status migrainosus: Secondary | ICD-10-CM | POA: Diagnosis not present

## 2023-10-18 DIAGNOSIS — C434 Malignant melanoma of scalp and neck: Secondary | ICD-10-CM | POA: Diagnosis not present

## 2023-10-29 ENCOUNTER — Other Ambulatory Visit: Payer: Self-pay | Admitting: Gastroenterology

## 2023-10-29 DIAGNOSIS — K22719 Barrett's esophagus with dysplasia, unspecified: Secondary | ICD-10-CM

## 2023-12-13 ENCOUNTER — Telehealth: Payer: Self-pay | Admitting: Family

## 2023-12-13 NOTE — Telephone Encounter (Signed)
 Left message for patient to call and schedule a follow visit with her provider, Rollene Northern. Patient has not seen Arnett since January 2024.  E2C2 please schedule patient with her provider, only.

## 2023-12-14 DIAGNOSIS — D2272 Melanocytic nevi of left lower limb, including hip: Secondary | ICD-10-CM | POA: Diagnosis not present

## 2023-12-14 DIAGNOSIS — L570 Actinic keratosis: Secondary | ICD-10-CM | POA: Diagnosis not present

## 2023-12-14 DIAGNOSIS — C7989 Secondary malignant neoplasm of other specified sites: Secondary | ICD-10-CM | POA: Diagnosis not present

## 2023-12-14 DIAGNOSIS — Z85820 Personal history of malignant melanoma of skin: Secondary | ICD-10-CM | POA: Diagnosis not present

## 2023-12-14 DIAGNOSIS — D2262 Melanocytic nevi of left upper limb, including shoulder: Secondary | ICD-10-CM | POA: Diagnosis not present

## 2023-12-14 DIAGNOSIS — D225 Melanocytic nevi of trunk: Secondary | ICD-10-CM | POA: Diagnosis not present

## 2023-12-14 DIAGNOSIS — C434 Malignant melanoma of scalp and neck: Secondary | ICD-10-CM | POA: Diagnosis not present

## 2023-12-14 DIAGNOSIS — D2261 Melanocytic nevi of right upper limb, including shoulder: Secondary | ICD-10-CM | POA: Diagnosis not present

## 2023-12-14 DIAGNOSIS — Z85828 Personal history of other malignant neoplasm of skin: Secondary | ICD-10-CM | POA: Diagnosis not present

## 2023-12-14 DIAGNOSIS — L720 Epidermal cyst: Secondary | ICD-10-CM | POA: Diagnosis not present

## 2023-12-14 HISTORY — PX: OTHER SURGICAL HISTORY: SHX169

## 2023-12-19 ENCOUNTER — Encounter

## 2023-12-19 ENCOUNTER — Ambulatory Visit: Payer: Medicare Other

## 2023-12-19 VITALS — Ht 65.0 in | Wt 135.0 lb

## 2023-12-19 DIAGNOSIS — Z0000 Encounter for general adult medical examination without abnormal findings: Secondary | ICD-10-CM

## 2023-12-19 NOTE — Progress Notes (Unsigned)
 Subjective:   Susan Stanley is a 73 y.o. who presents for a Medicare Wellness preventive visit.  As a reminder, Annual Wellness Visits don't include a physical exam, and some assessments may be limited, especially if this visit is performed virtually. We may recommend an in-person follow-up visit with your provider if needed.  Visit Complete: Virtual I connected with  Susan Stanley on 12/19/23 by a audio enabled telemedicine application and verified that I am speaking with the correct person using two identifiers.  Patient Location: Home  Provider Location: Home Office  I discussed the limitations of evaluation and management by telemedicine. The patient expressed understanding and agreed to proceed.  Vital Signs: Because this visit was a virtual/telehealth visit, some criteria may be missing or patient reported. Any vitals not documented were not able to be obtained and vitals that have been documented are patient reported.  VideoDeclined- This patient declined Librarian, academic. Therefore the visit was completed with audio only.  Persons Participating in Visit: Patient.  AWV Questionnaire: Yes: Patient Medicare AWV questionnaire was completed by the patient on 12/15/23; I have confirmed that all information answered by patient is correct and no changes since this date.  Cardiac Risk Factors include: advanced age (>31men, >61 women);hypertension     Objective:    Today's Vitals   12/19/23 1054  Weight: 135 lb (61.2 kg)  Height: 5' 5 (1.651 m)   Body mass index is 22.47 kg/m.     12/19/2023   11:04 AM 07/21/2023    1:22 PM 02/08/2023    8:48 AM 12/13/2022    9:47 AM 12/04/2022    8:55 AM 02/08/2022    3:13 PM 12/03/2021    9:14 AM  Advanced Directives  Does Patient Have a Medical Advance Directive? Yes Yes Yes Yes Yes No No  Type of Estate agent of [place];Living will Healthcare Power of [place];Living will Healthcare Power of  [place];Living will Healthcare Power of [place];Living will Healthcare Power of [place];Living will    Copy of Healthcare Power of Attorney in Chart? No - copy requested   No - copy requested     Would patient like information on creating a medical advance directive?       No - Patient declined    Current Medications (verified) Outpatient Encounter Medications as of 12/19/2023  Medication Sig   amLODipine  (NORVASC ) 2.5 MG tablet TAKE 1 TABLET BY MOUTH EVERY DAY   ascorbic acid (VITAMIN C) 500 MG tablet Take 500 mg by mouth daily.   Calcium 600-5 MG-MCG TABS Take 1 tablet by mouth daily.   Cholecalciferol  50 MCG (2000 UT) TABS Take 1 tablet by mouth daily.   Cyanocobalamin  (VITAMIN B 12 PO) Take by mouth.   Lactobacillus (PROBIOTIC ACIDOPHILUS PO) Take by mouth as needed.   Magnesium 200 MG TABS Take by mouth. Take one - two daily   Omega-3 Fatty Acids (FISH OIL) 1000 MG CAPS Take by mouth.   omeprazole  (PRILOSEC) 20 MG capsule TAKE 1 CAPSULE BY MOUTH EVERYDAY AT BEDTIME   No facility-administered encounter medications on file as of 12/19/2023.    Allergies (verified) Cat dander, Gluten meal, and Penicillin g   History: Past Medical History:  Diagnosis Date   Cancer (HCC)    atypical melanoma   Celiac disease    x20 years   Chicken pox    Hypertension    Past Surgical History:  Procedure Laterality Date   APPENDECTOMY     BIOPSY  07/21/2023   Procedure: BIOPSY;  Surgeon: Unk Corinn Skiff, MD;  Location: [facility] ENDOSCOPY;  Service: Gastroenterology;;   COLONOSCOPY WITH PROPOFOL  N/A 10/08/2019   Procedure: COLONOSCOPY WITH PROPOFOL ;  Surgeon: Unk Corinn Skiff, MD;  Location: [facility] ENDOSCOPY;  Service: Gastroenterology;  Laterality: N/A;   ESOPHAGOGASTRODUODENOSCOPY (EGD) WITH PROPOFOL  N/A 10/08/2019   Procedure: ESOPHAGOGASTRODUODENOSCOPY (EGD) WITH PROPOFOL ;  Surgeon: Unk Corinn Skiff, MD;  Location: [facility] ENDOSCOPY;  Service: Gastroenterology;  Laterality: N/A;    ESOPHAGOGASTRODUODENOSCOPY (EGD) WITH PROPOFOL  N/A 07/21/2023   Procedure: ESOPHAGOGASTRODUODENOSCOPY (EGD) WITH PROPOFOL ;  Surgeon: Unk Corinn Skiff, MD;  Location: [facility] ENDOSCOPY;  Service: Gastroenterology;  Laterality: N/A;   lesion removed from head  12/14/2023   melanoma removal  2011   top of head   melanoma removal from back  08/2022   MOHS SURGERY Left    Face   Family History  Problem Relation Age of Onset   Alcohol abuse Mother    COPD Mother    Alcohol abuse Father    Cancer Father        lung   Hypertension Father    Alcohol abuse Sister    Diabetes Maternal Grandfather    Arthritis Paternal Grandmother    Cancer Paternal Grandfather        Colon   Breast cancer Neg Hx    Social History   Socioeconomic History   Marital status: Divorced    Spouse name: Not on file   Number of children: Not on file   Years of education: Not on file   Highest education level: Associate degree: academic program  Occupational History   Not on file  Tobacco Use   Smoking status: Never   Smokeless tobacco: Never  Vaping Use   Vaping status: Never Used  Substance and Sexual Activity   Alcohol use: No   Drug use: No   Sexual activity: Not Currently    Birth control/protection: Post-menopausal  Other Topics Concern   Not on file  Social History Narrative   Daughter is Scientist, research (life sciences)      From [place]            Social Drivers of Health   Financial Resource Strain: Low Risk  (12/15/2023)   Overall Financial Resource Strain (CARDIA)    Difficulty of Paying Living Expenses: Not hard at all  Food Insecurity: No Food Insecurity (12/15/2023)   Hunger Vital Sign    Worried About Running Out of Food in the Last Year: Never true    Ran Out of Food in the Last Year: Never true  Transportation Needs: No Transportation Needs (12/15/2023)   PRAPARE - Administrator, Civil Service (Medical): No    Lack of Transportation (Non-Medical): No  Physical Activity:  Insufficiently Active (12/15/2023)   Exercise Vital Sign    Days of Exercise per Week: 3 days    Minutes of Exercise per Session: 20 min  Stress: Stress Concern Present (12/15/2023)   [employer] of Occupational Health - Occupational Stress Questionnaire    Feeling of Stress: To some extent  Social Connections: Moderately Integrated (12/15/2023)   Social Connection and Isolation Panel    Frequency of Communication with Friends and Family: More than three times a week    Frequency of Social Gatherings with Friends and Family: Twice a week    Attends Religious Services: 1 to 4 times per year    Active Member of [employer] or Organizations: Yes    Attends Banker Meetings: 1  to 4 times per year    Marital Status: Divorced    Tobacco Counseling Counseling given: Not Answered    Clinical Intake:  Pre-visit preparation completed: Yes  Pain : No/denies pain     BMI - recorded: 22.47 Nutritional Status: BMI of 19-24  Normal Nutritional Risks: None Diabetes: No  Lab Results  Component Value Date   HGBA1C 6.0 03/31/2021     How often do you need to have someone help you when you read instructions, pamphlets, or other written materials from your doctor or pharmacy?: 1 - Never  Interpreter Needed?: No  Information entered by :: R. Kassiah Mccrory LPN   Activities of Daily Living     12/19/2023   10:55 AM  In your present state of health, do you have any difficulty performing the following activities:  Hearing? 1  Comment wears aids  Vision? 0  Difficulty concentrating or making decisions? 0  Walking or climbing stairs? 0  Dressing or bathing? 0  Doing errands, shopping? 0  Preparing Food and eating ? N  Using the Toilet? N  In the past six months, have you accidently leaked urine? N  Do you have problems with loss of bowel control? N  Managing your Medications? N  Managing your Finances? N  Housekeeping or managing your Housekeeping? N    Patient Care  Team: Dineen Rollene MATSU, FNP as PCP - General (Family Medicine) Unk Corinn Skiff, MD as Consulting Physician (Gastroenterology) Dasher, Alm LABOR, MD (Dermatology) Maree Jannett POUR, MD as Consulting Physician (Neurology)  I have updated your Care Teams any recent Medical Services you may have received from other providers in the past year.     Assessment:   This is a routine wellness examination for Susan Stanley.  Hearing/Vision screen Hearing Screening - Comments:: Wears aids Vision Screening - Comments:: No correction   Goals Addressed             This Visit's Progress    Patient Stated       Wants to exercise and eat well       Depression Screen ***    12/19/2023   11:00 AM 07/15/2023    4:45 PM 06/30/2023   11:57 AM 12/13/2022    9:40 AM 07/07/2022    3:47 PM 06/14/2022    1:29 PM 05/14/2022    1:50 PM  PHQ 2/9 Scores  PHQ - 2 Score 0 0 0 0 0  0  PHQ- 9 Score 0 4  0     Exception Documentation      Patient refusal     Fall Risk ***    12/19/2023   10:57 AM 07/15/2023    4:45 PM 06/30/2023   11:57 AM 12/13/2022    9:49 AM 12/12/2022    1:47 PM  Fall Risk   Falls in the past year? 0 0 0 0 0  Number falls in past yr: 0 0 0 0   Injury with Fall? 0 0 0 0   Risk for fall due to : No Fall Risks No Fall Risks No Fall Risks No Fall Risks   Follow up Falls evaluation completed;Falls prevention discussed Falls prevention discussed;Falls evaluation completed Falls evaluation completed Falls prevention discussed;Falls evaluation completed     MEDICARE RISK AT HOME: *** Medicare Risk at Home Any stairs in or around the home?: No If so, are there any without handrails?: No Home free of loose throw rugs in walkways, pet beds, electrical cords, etc?: Yes Adequate lighting  in your home to reduce risk of falls?: Yes Life alert?: No Use of a cane, walker or w/c?: No Grab bars in the bathroom?: No Shower chair or bench in shower?: Yes Elevated toilet seat or a handicapped toilet?:  No  TIMED UP AND GO:  Was the test performed?  {[phone]}  Cognitive Function: {CognitiveScreening:32337}    08/15/2017    9:00 AM  MMSE - Mini Mental State Exam  Orientation to time 5  Orientation to Place 5  Registration 3  Attention/ Calculation 5  Recall 3  Language- name 2 objects 2  Language- repeat 1  Language- follow 3 step command 3  Language- read & follow direction 1  Write a sentence 1  Copy design 1  Total score 30        12/19/2023   11:04 AM 12/13/2022    9:51 AM  6CIT Screen  What Year? 0 points 0 points  What month? 0 points 0 points  What time? 0 points 0 points  Count back from 20 0 points 0 points  Months in reverse 0 points 0 points  Repeat phrase 0 points 0 points  Total Score 0 points 0 points    Immunizations Immunization History  Administered Date(s) Administered   Influenza-Unspecified 01/26/2019   Pneumococcal Conjugate-13 05/07/2016   Pneumococcal Polysaccharide-23 05/10/2017   Tdap 03/08/2016   Zoster Recombinant(Shingrix) 11/05/2016, 06/30/2017    Screening Tests Health Maintenance  Topic Date Due   Medicare Annual Wellness (AWV)  12/13/2023   INFLUENZA VACCINE  01/06/2024   Colonoscopy  10/07/2024   DTaP/Tdap/Td (2 - Td or Tdap) 03/08/2026   Pneumococcal Vaccine: 50+ Years  Completed   DEXA SCAN  Completed   Hepatitis C Screening  Completed   Zoster Vaccines- Shingrix  Completed   Hepatitis B Vaccines  Aged Out   HPV VACCINES  Aged Out   Meningococcal B Vaccine  Aged Out   COVID-19 Vaccine  Discontinued    Health Maintenance  Health Maintenance Due  Topic Date Due   Medicare Annual Wellness (AWV)  12/13/2023   Health Maintenance Items Addressed: {HMMCR (Optional):30011}  Additional Screening:  Vision Screening: Recommended annual ophthalmology exams for early detection of glaucoma and other disorders of the eye. Would you like a referral to an eye doctor? {YES/NO:21197}   Dental Screening:  Recommended annual dental exams for proper oral hygiene  Community Resource Referral / Chronic Care Management: CRR required this visit?  {YES/NO:21197}  CCM required this visit?  {CCM Required choices:[phone]}   Plan:    I have personally reviewed and noted the following in the patient's chart:   Medical and social history Use of alcohol, tobacco or illicit drugs  Current medications and supplements including opioid prescriptions. {Opioid Prescriptions:[phone]} Functional ability and status Nutritional status Physical activity Advanced directives List of other physicians Hospitalizations, surgeries, and ER visits in previous 12 months Vitals Screenings to include cognitive, depression, and falls Referrals and appointments  In addition, I have reviewed and discussed with patient certain preventive protocols, quality metrics, and best practice recommendations. A written personalized care plan for preventive services as well as general preventive health recommendations were provided to patient.   Angeline Fredericks, LPN   2/85/7974   After Visit Summary: {CHL AMB AWV After Visit Summary:[id number]}  Notes: {Nurse Notes:32343}

## 2023-12-20 ENCOUNTER — Ambulatory Visit (INDEPENDENT_AMBULATORY_CARE_PROVIDER_SITE_OTHER)

## 2023-12-20 VITALS — Ht 65.0 in | Wt 135.0 lb

## 2023-12-20 DIAGNOSIS — Z0000 Encounter for general adult medical examination without abnormal findings: Secondary | ICD-10-CM

## 2023-12-20 NOTE — Progress Notes (Signed)
 Subjective:   Susan Stanley is a 73 y.o. who presents for a Medicare Wellness preventive visit.  As a reminder, Annual Wellness Visits don't include a physical exam, and some assessments may be limited, especially if this visit is performed virtually. We may recommend an in-person follow-up visit with your provider if needed.  Visit Complete: Virtual I connected with  Susan Stanley on 12/20/23 by a audio enabled telemedicine application and verified that I am speaking with the correct person using two identifiers.  Patient Location: Home  Provider Location: Home Office  I discussed the limitations of evaluation and management by telemedicine. The patient expressed understanding and agreed to proceed.  Vital Signs: Because this visit was a virtual/telehealth visit, some criteria may be missing or patient reported. Any vitals not documented were not able to be obtained and vitals that have been documented are patient reported.  VideoDeclined- This patient declined Librarian, academic. Therefore the visit was completed with audio only.  Persons Participating in Visit: Patient.  AWV Questionnaire: Yes: Patient Medicare AWV questionnaire was completed by the patient on 12/15/23; I have confirmed that all information answered by patient is correct and no changes since this date.  Cardiac Risk Factors include: advanced age (>69men, >62 women);hypertension     Objective:    Today's Vitals   12/20/23 1123  Weight: 135 lb (61.2 kg)  Height: 5' 5 (1.651 m)    Body mass index is 22.47 kg/m.     12/20/2023   11:29 AM 12/19/2023   11:04 AM 07/21/2023    1:22 PM 02/08/2023    8:48 AM 12/13/2022    9:47 AM 12/04/2022    8:55 AM 02/08/2022    3:13 PM  Advanced Directives  Does Patient Have a Medical Advance Directive? Yes Yes Yes Yes Yes Yes No  Type of Estate agent of [place];Living will Healthcare Power of [place];Living will Healthcare Power of  [place];Living will Healthcare Power of [place];Living will Healthcare Power of [place];Living will Healthcare Power of [place];Living will   Copy of Healthcare Power of Attorney in Chart? No - copy requested No - copy requested   No - copy requested      Current Medications (verified) Outpatient Encounter Medications as of 12/20/2023  Medication Sig   amLODipine  (NORVASC ) 2.5 MG tablet TAKE 1 TABLET BY MOUTH EVERY DAY   ascorbic acid (VITAMIN C) 500 MG tablet Take 500 mg by mouth daily.   Calcium 600-5 MG-MCG TABS Take 1 tablet by mouth daily.   Cholecalciferol  50 MCG (2000 UT) TABS Take 1 tablet by mouth daily.   Cyanocobalamin  (VITAMIN B 12 PO) Take by mouth.   Lactobacillus (PROBIOTIC ACIDOPHILUS PO) Take by mouth as needed.   Magnesium 200 MG TABS Take by mouth. Take one - two daily   Omega-3 Fatty Acids (FISH OIL) 1000 MG CAPS Take by mouth.   omeprazole  (PRILOSEC) 20 MG capsule TAKE 1 CAPSULE BY MOUTH EVERYDAY AT BEDTIME   No facility-administered encounter medications on file as of 12/20/2023.    Allergies (verified) Cat dander, Gluten meal, and Penicillin g   History: Past Medical History:  Diagnosis Date   Cancer (HCC)    atypical melanoma   Celiac disease    x20 years   Chicken pox    Hypertension    Past Surgical History:  Procedure Laterality Date   APPENDECTOMY     BIOPSY  07/21/2023   Procedure: BIOPSY;  Surgeon: Unk Corinn Skiff, MD;  Location[facility]  ENDOSCOPY;  Service: Gastroenterology;;   COLONOSCOPY WITH PROPOFOL  N/A 10/08/2019   Procedure: COLONOSCOPY WITH PROPOFOL ;  Surgeon: Unk Corinn Skiff, MD;  Location: [facility] ENDOSCOPY;  Service: Gastroenterology;  Laterality: N/A;   ESOPHAGOGASTRODUODENOSCOPY (EGD) WITH PROPOFOL  N/A 10/08/2019   Procedure: ESOPHAGOGASTRODUODENOSCOPY (EGD) WITH PROPOFOL ;  Surgeon: Unk Corinn Skiff, MD;  Location: [facility] ENDOSCOPY;  Service: Gastroenterology;  Laterality: N/A;   ESOPHAGOGASTRODUODENOSCOPY (EGD) WITH PROPOFOL   N/A 07/21/2023   Procedure: ESOPHAGOGASTRODUODENOSCOPY (EGD) WITH PROPOFOL ;  Surgeon: Unk Corinn Skiff, MD;  Location: [facility] ENDOSCOPY;  Service: Gastroenterology;  Laterality: N/A;   lesion removed from head  12/14/2023   melanoma removal  2011   top of head   melanoma removal from back  08/2022   MOHS SURGERY Left    Face   Family History  Problem Relation Age of Onset   Alcohol abuse Mother    COPD Mother    Alcohol abuse Father    Cancer Father        lung   Hypertension Father    Alcohol abuse Sister    Diabetes Maternal Grandfather    Arthritis Paternal Grandmother    Cancer Paternal Grandfather        Colon   Breast cancer Neg Hx    Social History   Socioeconomic History   Marital status: Divorced    Spouse name: Not on file   Number of children: Not on file   Years of education: Not on file   Highest education level: Associate degree: academic program  Occupational History   Not on file  Tobacco Use   Smoking status: Never   Smokeless tobacco: Never  Vaping Use   Vaping status: Never Used  Substance and Sexual Activity   Alcohol use: No   Drug use: No   Sexual activity: Not Currently    Birth control/protection: Post-menopausal  Other Topics Concern   Not on file  Social History Narrative   Daughter is Scientist, research (life sciences)      From [place]            Social Drivers of Health   Financial Resource Strain: Low Risk  (12/20/2023)   Overall Financial Resource Strain (CARDIA)    Difficulty of Paying Living Expenses: Not hard at all  Food Insecurity: No Food Insecurity (12/15/2023)   Hunger Vital Sign    Worried About Running Out of Food in the Last Year: Never true    Ran Out of Food in the Last Year: Never true  Transportation Needs: No Transportation Needs (12/20/2023)   PRAPARE - Administrator, Civil Service (Medical): No    Lack of Transportation (Non-Medical): No  Physical Activity: Insufficiently Active (12/20/2023)   Exercise Vital Sign     Days of Exercise per Week: 3 days    Minutes of Exercise per Session: 20 min  Stress: Stress Concern Present (12/20/2023)   [employer] of Occupational Health - Occupational Stress Questionnaire    Feeling of Stress: To some extent  Social Connections: Moderately Integrated (12/20/2023)   Social Connection and Isolation Panel    Frequency of Communication with Friends and Family: More than three times a week    Frequency of Social Gatherings with Friends and Family: Twice a week    Attends Religious Services: 1 to 4 times per year    Active Member of [employer] or Organizations: Yes    Attends Banker Meetings: 1 to 4 times per year    Marital Status: Divorced  Tobacco Counseling Counseling given: Not Answered    Clinical Intake:  Pre-visit preparation completed: Yes  Pain : No/denies pain     BMI - recorded: 22.47 Nutritional Status: BMI of 19-24  Normal Nutritional Risks: None Diabetes: No  Lab Results  Component Value Date   HGBA1C 6.0 03/31/2021     How often do you need to have someone help you when you read instructions, pamphlets, or other written materials from your doctor or pharmacy?: 1 - Never  Interpreter Needed?: No  Information entered by :: R. Sostenes Kauffmann LPN   Activities of Daily Living     12/20/2023   11:25 AM 12/19/2023   10:55 AM  In your present state of health, do you have any difficulty performing the following activities:  Hearing? 1 1  Comment wears aids wears aids  Vision? 0 0  Difficulty concentrating or making decisions? 0 0  Walking or climbing stairs? 0 0  Dressing or bathing? 0 0  Doing errands, shopping? 0 0  Preparing Food and eating ? N N  Using the Toilet? N N  In the past six months, have you accidently leaked urine? N N  Do you have problems with loss of bowel control? N N  Managing your Medications? N N  Managing your Finances? N N  Housekeeping or managing your Housekeeping? N N    Patient Care  Team: Dineen Rollene MATSU, FNP as PCP - General (Family Medicine) Unk Corinn Skiff, MD as Consulting Physician (Gastroenterology) Dasher, Alm LABOR, MD (Dermatology) Maree Jannett POUR, MD as Consulting Physician (Neurology)  I have updated your Care Teams any recent Medical Services you may have received from other providers in the past year.     Assessment:   This is a routine wellness examination for Damani.  Hearing/Vision screen Hearing Screening - Comments:: Wears aids Vision Screening - Comments:: No correction   Goals Addressed   None    Depression Screen     12/20/2023   11:27 AM 12/19/2023   11:00 AM 07/15/2023    4:45 PM 06/30/2023   11:57 AM 12/13/2022    9:40 AM 07/07/2022    3:47 PM 06/14/2022    1:29 PM  PHQ 2/9 Scores  PHQ - 2 Score 0 0 0 0 0 0   PHQ- 9 Score  0 4  0    Exception Documentation       Patient refusal    Fall Risk     12/20/2023   11:26 AM 12/19/2023   10:57 AM 07/15/2023    4:45 PM 06/30/2023   11:57 AM 12/13/2022    9:49 AM  Fall Risk   Falls in the past year? 0 0 0 0 0  Number falls in past yr: 0 0 0 0 0  Injury with Fall? 0 0 0 0 0  Risk for fall due to : No Fall Risks No Fall Risks No Fall Risks No Fall Risks No Fall Risks  Follow up Falls evaluation completed;Falls prevention discussed Falls evaluation completed;Falls prevention discussed Falls prevention discussed;Falls evaluation completed Falls evaluation completed Falls prevention discussed;Falls evaluation completed    MEDICARE RISK AT HOME:  Medicare Risk at Home Any stairs in or around the home?: No If so, are there any without handrails?: No Home free of loose throw rugs in walkways, pet beds, electrical cords, etc?: Yes Adequate lighting in your home to reduce risk of falls?: Yes Life alert?: No Use of a cane, walker or w/c?: No Grab bars  in the bathroom?: No Shower chair or bench in shower?: Yes Elevated toilet seat or a handicapped toilet?: No  TIMED UP AND GO:  Was the test  performed?  No  Cognitive Function: 6CIT completed    08/15/2017    9:00 AM  MMSE - Mini Mental State Exam  Orientation to time 5  Orientation to Place 5  Registration 3  Attention/ Calculation 5  Recall 3  Language- name 2 objects 2  Language- repeat 1  Language- follow 3 step command 3  Language- read & follow direction 1  Write a sentence 1  Copy design 1  Total score 30        12/20/2023   11:29 AM 12/19/2023   11:04 AM 12/13/2022    9:51 AM  6CIT Screen  What Year? 0 points 0 points 0 points  What month? 0 points 0 points 0 points  What time? 0 points 0 points 0 points  Count back from 20 0 points 0 points 0 points  Months in reverse 0 points 0 points 0 points  Repeat phrase 0 points 0 points 0 points  Total Score 0 points 0 points 0 points    Immunizations Immunization History  Administered Date(s) Administered   Influenza-Unspecified 01/26/2019   Pneumococcal Conjugate-13 05/07/2016   Pneumococcal Polysaccharide-23 05/10/2017   Tdap 03/08/2016   Zoster Recombinant(Shingrix) 11/05/2016, 06/30/2017    Screening Tests Health Maintenance  Topic Date Due   INFLUENZA VACCINE  01/06/2024   Colonoscopy  10/07/2024   Medicare Annual Wellness (AWV)  12/19/2024   DTaP/Tdap/Td (2 - Td or Tdap) 03/08/2026   Pneumococcal Vaccine: 50+ Years  Completed   DEXA SCAN  Completed   Hepatitis C Screening  Completed   Zoster Vaccines- Shingrix  Completed   Hepatitis B Vaccines  Aged Out   HPV VACCINES  Aged Out   Meningococcal B Vaccine  Aged Out   COVID-19 Vaccine  Discontinued    Health Maintenance  There are no preventive care reminders to display for this patient.  Health Maintenance Items Addressed: Patient declines vaccines  Additional Screening:  Vision Screening: Recommended annual ophthalmology exams for early detection of glaucoma and other disorders of the eye. Up to date [facility] Would you like a referral to an eye doctor? No    Dental Screening:  Recommended annual dental exams for proper oral hygiene  Community Resource Referral / Chronic Care Management: CRR required this visit?  No   CCM required this visit?  No   Plan:    I have personally reviewed and noted the following in the patient's chart:   Medical and social history Use of alcohol, tobacco or illicit drugs  Current medications and supplements including opioid prescriptions. Patient is not currently taking opioid prescriptions. Functional ability and status Nutritional status Physical activity Advanced directives List of other physicians Hospitalizations, surgeries, and ER visits in previous 12 months Vitals Screenings to include cognitive, depression, and falls Referrals and appointments  In addition, I have reviewed and discussed with patient certain preventive protocols, quality metrics, and best practice recommendations. A written personalized care plan for preventive services as well as general preventive health recommendations were provided to patient.   Angeline Fredericks, LPN   2/84/7974   After Visit Summary: (MyChart) Due to this being a telephonic visit, the after visit summary with patients personalized plan was offered to patient via MyChart   Notes: Nothing significant to report at this time.

## 2023-12-20 NOTE — Patient Instructions (Signed)
 Susan Stanley , Thank you for taking time out of your busy schedule to complete your Annual Wellness Visit with me. I enjoyed our conversation and look forward to speaking with you again next year. I, as well as your care team,  appreciate your ongoing commitment to your health goals. Please review the following plan we discussed and let me know if I can assist you in the future. Your Game plan/ To Do List    Referrals: If you haven't heard from the office you've been referred to, please reach out to them at the phone provided.  Consider updating your vaccines Follow up Visits: Next Medicare AWV with our clinical staff: 12/21/24 @ 10:10   Have you seen your provider in the last 6 months (3 months if uncontrolled diabetes)? Yes Next Office Visit with your provider: Remember to call and schedule an appointment with your PCP  Clinician Recommendations:  Aim for 30 minutes of exercise or brisk walking, 6-8 glasses of water, and 5 servings of fruits and vegetables each day.       This is a list of the screening recommended for you and due dates:  Health Maintenance  Topic Date Due   Flu Shot  01/06/2024   Colon Cancer Screening  10/07/2024   Medicare Annual Wellness Visit  12/18/2024   DTaP/Tdap/Td vaccine (2 - Td or Tdap) 03/08/2026   Pneumococcal Vaccine for age over 18  Completed   DEXA scan (bone density measurement)  Completed   Hepatitis C Screening  Completed   Zoster (Shingles) Vaccine  Completed   Hepatitis B Vaccine  Aged Out   HPV Vaccine  Aged Out   Meningitis B Vaccine  Aged Out   COVID-19 Vaccine  Discontinued    Advanced directives: (Copy Requested) Please bring a copy of your health care power of attorney and living will to the office to be added to your chart at your convenience. You can mail to [facility] [address] [place], [place] [address] or email to [email or web address] Advance Care Planning is important because it:  [x]  Makes sure you  receive the medical care that is consistent with your values, goals, and preferences  [x]  It provides guidance to your family and loved ones and reduces their decisional burden about whether or not they are making the right decisions based on your wishes.

## 2023-12-20 NOTE — Patient Instructions (Signed)
 Ms. Susan Stanley , Thank you for taking time out of your busy schedule to complete your Annual Wellness Visit with me. I enjoyed our conversation and look forward to speaking with you again next year. I, as well as your care team,  appreciate your ongoing commitment to your health goals. Please review the following plan we discussed and let me know if I can assist you in the future. Your Game plan/ To Do List    Referrals: If you haven't heard from the office you've been referred to, please reach out to them at the phone provided.  Consider updating your vaccines Follow up Visits: Next Medicare AWV with our clinical staff: 12/21/24 @ 10:10   Have you seen your provider in the last 6 months (3 months if uncontrolled diabetes)? Yes Next Office Visit with your provider: Declines to schedule appointment, will call the office  Clinician Recommendations:  Aim for 30 minutes of exercise or brisk walking, 6-8 glasses of water, and 5 servings of fruits and vegetables each day.       This is a list of the screening recommended for you and due dates:  Health Maintenance  Topic Date Due   Flu Shot  01/06/2024   Colon Cancer Screening  10/07/2024   Medicare Annual Wellness Visit  12/18/2024   DTaP/Tdap/Td vaccine (2 - Td or Tdap) 03/08/2026   Pneumococcal Vaccine for age over 72  Completed   DEXA scan (bone density measurement)  Completed   Hepatitis C Screening  Completed   Zoster (Shingles) Vaccine  Completed   Hepatitis B Vaccine  Aged Out   HPV Vaccine  Aged Out   Meningitis B Vaccine  Aged Out   COVID-19 Vaccine  Discontinued    Advanced directives: (Copy Requested) Please bring a copy of your health care power of attorney and living will to the office to be added to your chart at your convenience. You can mail to [facility] [address] [place], [place] [address] or email to [email or web address] Advance Care Planning is important because it:  [x]  Makes sure you receive  the medical care that is consistent with your values, goals, and preferences  [x]  It provides guidance to your family and loved ones and reduces their decisional burden about whether or not they are making the right decisions based on your wishes.

## 2023-12-22 DIAGNOSIS — Z85828 Personal history of other malignant neoplasm of skin: Secondary | ICD-10-CM | POA: Diagnosis not present

## 2024-01-12 DIAGNOSIS — H903 Sensorineural hearing loss, bilateral: Secondary | ICD-10-CM | POA: Diagnosis not present

## 2024-01-12 DIAGNOSIS — H6123 Impacted cerumen, bilateral: Secondary | ICD-10-CM | POA: Diagnosis not present

## 2024-03-08 DIAGNOSIS — E785 Hyperlipidemia, unspecified: Secondary | ICD-10-CM | POA: Diagnosis not present

## 2024-03-14 DIAGNOSIS — C434 Malignant melanoma of scalp and neck: Secondary | ICD-10-CM | POA: Diagnosis not present

## 2024-03-14 DIAGNOSIS — Z85820 Personal history of malignant melanoma of skin: Secondary | ICD-10-CM | POA: Diagnosis not present

## 2024-03-14 DIAGNOSIS — L910 Hypertrophic scar: Secondary | ICD-10-CM | POA: Diagnosis not present

## 2024-05-08 DIAGNOSIS — H903 Sensorineural hearing loss, bilateral: Secondary | ICD-10-CM | POA: Diagnosis not present

## 2024-05-08 DIAGNOSIS — H6123 Impacted cerumen, bilateral: Secondary | ICD-10-CM | POA: Diagnosis not present

## 2024-05-18 DIAGNOSIS — L910 Hypertrophic scar: Secondary | ICD-10-CM | POA: Diagnosis not present

## 2024-12-21 ENCOUNTER — Ambulatory Visit
